# Patient Record
Sex: Female | Born: 1965 | Race: Black or African American | Hispanic: No | Smoking: Never smoker
Health system: Southern US, Community
[De-identification: ages and names within clinical notes are randomized; demographics above are authoritative.]

## PROBLEM LIST (undated history)

## (undated) DIAGNOSIS — D352 Benign neoplasm of pituitary gland: Secondary | ICD-10-CM

## (undated) DIAGNOSIS — M7520 Bicipital tendinitis, unspecified shoulder: Secondary | ICD-10-CM

## (undated) HISTORY — DX: Benign neoplasm of pituitary gland: D35.2

## (undated) HISTORY — DX: Bicipital tendinitis, unspecified shoulder: M75.20

## (undated) HISTORY — PX: CHOLECYSTECTOMY: SHX55

## (undated) HISTORY — PX: ABDOMINAL HYSTERECTOMY: SHX81

## (undated) HISTORY — PX: TONSILLECTOMY: SUR1361

## (undated) HISTORY — PX: MYOMECTOMY: SHX85

---

## 2009-09-10 ENCOUNTER — Emergency Department: Payer: Self-pay | Admitting: Internal Medicine

## 2009-11-23 ENCOUNTER — Emergency Department: Payer: Self-pay | Admitting: Emergency Medicine

## 2009-11-28 ENCOUNTER — Ambulatory Visit: Payer: Self-pay | Admitting: Internal Medicine

## 2009-12-17 ENCOUNTER — Ambulatory Visit: Payer: Self-pay | Admitting: Internal Medicine

## 2009-12-23 ENCOUNTER — Ambulatory Visit: Payer: Self-pay | Admitting: Family Medicine

## 2010-03-19 ENCOUNTER — Ambulatory Visit: Payer: Self-pay | Admitting: Otolaryngology

## 2010-05-28 ENCOUNTER — Ambulatory Visit: Payer: Self-pay | Admitting: Otolaryngology

## 2010-09-13 ENCOUNTER — Emergency Department: Payer: Self-pay | Admitting: Emergency Medicine

## 2010-10-11 ENCOUNTER — Emergency Department: Payer: Self-pay | Admitting: Internal Medicine

## 2011-04-03 ENCOUNTER — Ambulatory Visit: Payer: Self-pay | Admitting: Otolaryngology

## 2011-04-24 ENCOUNTER — Ambulatory Visit: Payer: Self-pay | Admitting: Otolaryngology

## 2011-06-24 ENCOUNTER — Ambulatory Visit: Payer: Self-pay | Admitting: Internal Medicine

## 2011-07-03 DIAGNOSIS — N952 Postmenopausal atrophic vaginitis: Secondary | ICD-10-CM | POA: Insufficient documentation

## 2011-07-03 DIAGNOSIS — R3129 Other microscopic hematuria: Secondary | ICD-10-CM | POA: Insufficient documentation

## 2011-07-03 DIAGNOSIS — IMO0002 Reserved for concepts with insufficient information to code with codable children: Secondary | ICD-10-CM | POA: Insufficient documentation

## 2011-07-03 DIAGNOSIS — N390 Urinary tract infection, site not specified: Secondary | ICD-10-CM | POA: Insufficient documentation

## 2011-07-03 DIAGNOSIS — N3281 Overactive bladder: Secondary | ICD-10-CM | POA: Insufficient documentation

## 2011-07-16 DIAGNOSIS — R9389 Abnormal findings on diagnostic imaging of other specified body structures: Secondary | ICD-10-CM | POA: Insufficient documentation

## 2011-08-30 ENCOUNTER — Inpatient Hospital Stay: Payer: Self-pay | Admitting: Internal Medicine

## 2011-08-30 LAB — COMPREHENSIVE METABOLIC PANEL
Anion Gap: 12 (ref 7–16)
Calcium, Total: 8.8 mg/dL (ref 8.5–10.1)
Chloride: 104 mmol/L (ref 98–107)
Co2: 27 mmol/L (ref 21–32)
Creatinine: 0.92 mg/dL (ref 0.60–1.30)
EGFR (African American): >60
EGFR (Non-African Amer.): >60
Osmolality: 285 (ref 275–301)
Potassium: 3.9 mmol/L (ref 3.5–5.1)
SGOT(AST): 32 U/L (ref 15–37)
SGPT (ALT): 49 U/L

## 2011-08-30 LAB — TROPONIN I: Troponin-I: <0.02 ng/mL

## 2011-08-30 LAB — CBC
HGB: 13.3 g/dL (ref 12.0–16.0)
MCH: 30.7 pg (ref 26.0–34.0)
MCHC: 33.9 g/dL (ref 32.0–36.0)
MCV: 91 fL (ref 80–100)
RBC: 4.31 10*6/uL (ref 3.80–5.20)
RDW: 13.9 % (ref 11.5–14.5)
WBC: 4.8 10*3/uL (ref 3.6–11.0)

## 2011-08-30 LAB — CK TOTAL AND CKMB (NOT AT ARMC): CK, Total: 129 U/L (ref 21–215)

## 2011-08-31 LAB — CBC WITH DIFFERENTIAL/PLATELET
Basophil #: 0 10*3/uL (ref 0.0–0.1)
Basophil %: 0.6 %
Eosinophil #: 0.1 10*3/uL (ref 0.0–0.7)
HGB: 12.4 g/dL (ref 12.0–16.0)
Lymphocyte %: 31.2 %
MCHC: 33.4 g/dL (ref 32.0–36.0)
Neutrophil #: 2.9 10*3/uL (ref 1.4–6.5)
RBC: 4.1 10*6/uL (ref 3.80–5.20)
RDW: 14.6 % — ABNORMAL HIGH (ref 11.5–14.5)
WBC: 4.8 10*3/uL (ref 3.6–11.0)

## 2011-08-31 LAB — LIPID PANEL
Cholesterol: 213 mg/dL — ABNORMAL HIGH (ref 0–200)
HDL Cholesterol: 67 mg/dL — ABNORMAL HIGH (ref 40–60)
Triglycerides: 112 mg/dL (ref 0–200)
VLDL Cholesterol, Calc: 22 mg/dL (ref 5–40)

## 2011-08-31 LAB — BASIC METABOLIC PANEL
BUN: 15 mg/dL (ref 7–18)
Calcium, Total: 8.9 mg/dL (ref 8.5–10.1)
Chloride: 103 mmol/L (ref 98–107)
Creatinine: 0.73 mg/dL (ref 0.60–1.30)
EGFR (African American): >60
EGFR (Non-African Amer.): >60
Glucose: 83 mg/dL (ref 65–99)
Potassium: 3.8 mmol/L (ref 3.5–5.1)
Sodium: 141 mmol/L (ref 136–145)

## 2011-08-31 LAB — TSH: Thyroid Stimulating Horm: 2.75 u[IU]/mL

## 2011-10-24 ENCOUNTER — Emergency Department: Payer: Self-pay | Admitting: Emergency Medicine

## 2011-11-06 ENCOUNTER — Emergency Department: Payer: Self-pay | Admitting: Emergency Medicine

## 2011-12-25 ENCOUNTER — Ambulatory Visit: Payer: Self-pay | Admitting: Otolaryngology

## 2012-01-07 ENCOUNTER — Ambulatory Visit: Payer: Self-pay | Admitting: Internal Medicine

## 2012-01-07 ENCOUNTER — Ambulatory Visit: Payer: Self-pay | Admitting: Family Medicine

## 2012-03-12 ENCOUNTER — Emergency Department: Payer: Self-pay | Admitting: Unknown Physician Specialty

## 2012-03-12 LAB — CK TOTAL AND CKMB (NOT AT ARMC)
CK, Total: 98 U/L (ref 21–215)
CK-MB: <0.5 ng/mL — ABNORMAL LOW (ref 0.5–3.6)

## 2012-03-12 LAB — BASIC METABOLIC PANEL
Anion Gap: 5 — ABNORMAL LOW (ref 7–16)
Calcium, Total: 8.8 mg/dL (ref 8.5–10.1)
Chloride: 107 mmol/L (ref 98–107)
Co2: 30 mmol/L (ref 21–32)
Creatinine: 0.83 mg/dL (ref 0.60–1.30)
EGFR (African American): >60
Osmolality: 283 (ref 275–301)
Sodium: 142 mmol/L (ref 136–145)

## 2012-03-12 LAB — CBC
HCT: 36 % (ref 35.0–47.0)
HGB: 12.3 g/dL (ref 12.0–16.0)
MCHC: 34 g/dL (ref 32.0–36.0)
RBC: 4.11 10*6/uL (ref 3.80–5.20)
WBC: 6.3 10*3/uL (ref 3.6–11.0)

## 2012-03-12 LAB — TROPONIN I: Troponin-I: <0.02 ng/mL

## 2012-03-13 LAB — HEPATIC FUNCTION PANEL A (ARMC)
Alkaline Phosphatase: 85 U/L (ref 50–136)
Bilirubin, Direct: <0.1 mg/dL (ref 0.00–0.20)
Bilirubin,Total: 0.2 mg/dL (ref 0.2–1.0)
SGOT(AST): 19 U/L (ref 15–37)

## 2012-03-13 LAB — LIPASE, BLOOD: Lipase: 106 U/L (ref 73–393)

## 2012-03-13 LAB — TROPONIN I: Troponin-I: <0.02 ng/mL

## 2012-04-13 ENCOUNTER — Emergency Department: Payer: Self-pay | Admitting: Unknown Physician Specialty

## 2012-06-01 ENCOUNTER — Ambulatory Visit: Payer: Self-pay | Admitting: Internal Medicine

## 2012-06-26 ENCOUNTER — Emergency Department: Payer: Self-pay | Admitting: Emergency Medicine

## 2012-06-26 LAB — CBC
HGB: 12.4 g/dL (ref 12.0–16.0)
MCH: 29.2 pg (ref 26.0–34.0)
MCV: 90 fL (ref 80–100)
Platelet: 256 10*3/uL (ref 150–440)
RBC: 4.25 10*6/uL (ref 3.80–5.20)
WBC: 7.8 10*3/uL (ref 3.6–11.0)

## 2012-06-26 LAB — BASIC METABOLIC PANEL
Anion Gap: 6 — ABNORMAL LOW (ref 7–16)
Chloride: 105 mmol/L (ref 98–107)
Co2: 27 mmol/L (ref 21–32)
EGFR (African American): >60
Glucose: 103 mg/dL — ABNORMAL HIGH (ref 65–99)
Osmolality: 276 (ref 275–301)

## 2012-06-26 LAB — CK TOTAL AND CKMB (NOT AT ARMC)
CK, Total: 93 U/L (ref 21–215)
CK-MB: <0.5 ng/mL — ABNORMAL LOW (ref 0.5–3.6)

## 2012-06-26 LAB — TROPONIN I: Troponin-I: <0.02 ng/mL

## 2012-06-27 LAB — CK TOTAL AND CKMB (NOT AT ARMC)
CK, Total: 79 U/L (ref 21–215)
CK-MB: <0.5 ng/mL — ABNORMAL LOW (ref 0.5–3.6)

## 2012-07-20 ENCOUNTER — Ambulatory Visit: Payer: Self-pay | Admitting: Internal Medicine

## 2012-09-03 ENCOUNTER — Emergency Department: Payer: Self-pay | Admitting: Internal Medicine

## 2012-09-03 LAB — COMPREHENSIVE METABOLIC PANEL
Albumin: 3.6 g/dL (ref 3.4–5.0)
Alkaline Phosphatase: 84 U/L (ref 50–136)
Anion Gap: 6 — ABNORMAL LOW (ref 7–16)
BUN: 10 mg/dL (ref 7–18)
Bilirubin,Total: 0.2 mg/dL (ref 0.2–1.0)
Calcium, Total: 8.7 mg/dL (ref 8.5–10.1)
Chloride: 106 mmol/L (ref 98–107)
Co2: 29 mmol/L (ref 21–32)
EGFR (African American): >60
EGFR (Non-African Amer.): >60
Glucose: 78 mg/dL (ref 65–99)
Osmolality: 279 (ref 275–301)
Potassium: 3.9 mmol/L (ref 3.5–5.1)
SGOT(AST): 21 U/L (ref 15–37)
SGPT (ALT): 25 U/L (ref 12–78)

## 2012-09-03 LAB — URINALYSIS, COMPLETE
Bacteria: NONE SEEN
Bilirubin,UR: NEGATIVE
Glucose,UR: NEGATIVE mg/dL (ref 0–75)
Ketone: NEGATIVE
Leukocyte Esterase: NEGATIVE
Ph: 5 (ref 4.5–8.0)
Protein: NEGATIVE
RBC,UR: 27 /HPF (ref 0–5)
Squamous Epithelial: 2
WBC UR: 1 /HPF (ref 0–5)

## 2012-09-03 LAB — CBC
MCH: 29.2 pg (ref 26.0–34.0)
MCHC: 33.4 g/dL (ref 32.0–36.0)
Platelet: 314 10*3/uL (ref 150–440)
RDW: 14 % (ref 11.5–14.5)
WBC: 5.3 10*3/uL (ref 3.6–11.0)

## 2012-09-03 LAB — LIPASE, BLOOD: Lipase: 106 U/L (ref 73–393)

## 2012-11-06 LAB — TROPONIN I: Troponin-I: <0.02 ng/mL

## 2012-11-06 LAB — CBC
HCT: 34.4 % — ABNORMAL LOW (ref 35.0–47.0)
HGB: 11.5 g/dL — ABNORMAL LOW (ref 12.0–16.0)
Platelet: 285 10*3/uL (ref 150–440)
RBC: 3.98 10*6/uL (ref 3.80–5.20)
RDW: 14.6 % — ABNORMAL HIGH (ref 11.5–14.5)
WBC: 7.5 10*3/uL (ref 3.6–11.0)

## 2012-11-06 LAB — BASIC METABOLIC PANEL
Anion Gap: 7 (ref 7–16)
Calcium, Total: 8.7 mg/dL (ref 8.5–10.1)
Co2: 24 mmol/L (ref 21–32)
Creatinine: 0.92 mg/dL (ref 0.60–1.30)
Glucose: 91 mg/dL (ref 65–99)
Sodium: 138 mmol/L (ref 136–145)

## 2012-11-06 LAB — CK TOTAL AND CKMB (NOT AT ARMC)
CK, Total: 83 U/L (ref 21–215)
CK-MB: <0.5 ng/mL — ABNORMAL LOW (ref 0.5–3.6)

## 2012-11-07 ENCOUNTER — Observation Stay: Payer: Self-pay | Admitting: Internal Medicine

## 2012-11-07 LAB — TROPONIN I: Troponin-I: <0.02 ng/mL

## 2012-11-07 LAB — CK TOTAL AND CKMB (NOT AT ARMC)
CK, Total: 58 U/L (ref 21–215)
CK, Total: 60 U/L (ref 21–215)
CK-MB: <0.5 ng/mL — ABNORMAL LOW (ref 0.5–3.6)
CK-MB: <0.5 ng/mL — ABNORMAL LOW (ref 0.5–3.6)

## 2012-11-07 LAB — LIPID PANEL
Cholesterol: 195 mg/dL (ref 0–200)
Ldl Cholesterol, Calc: 119 mg/dL — ABNORMAL HIGH (ref 0–100)
Triglycerides: 101 mg/dL (ref 0–200)
VLDL Cholesterol, Calc: 20 mg/dL (ref 5–40)

## 2012-12-29 ENCOUNTER — Ambulatory Visit: Payer: Self-pay | Admitting: Otolaryngology

## 2013-01-09 ENCOUNTER — Ambulatory Visit: Payer: Self-pay | Admitting: Otolaryngology

## 2013-01-09 LAB — CALCIUM
Calcium, Total: 8.9 mg/dL (ref 8.5–10.1)
Calcium, Total: 8.9 mg/dL (ref 8.5–10.1)

## 2013-01-16 ENCOUNTER — Ambulatory Visit: Payer: Self-pay | Admitting: Otolaryngology

## 2013-01-31 ENCOUNTER — Ambulatory Visit: Payer: Self-pay | Admitting: Otolaryngology

## 2013-02-08 ENCOUNTER — Ambulatory Visit: Payer: Self-pay | Admitting: Otolaryngology

## 2013-02-14 ENCOUNTER — Institutional Professional Consult (permissible substitution): Payer: Self-pay | Admitting: Pulmonary Disease

## 2013-02-15 ENCOUNTER — Encounter: Payer: Self-pay | Admitting: Otolaryngology

## 2013-06-09 ENCOUNTER — Emergency Department: Payer: Self-pay | Admitting: Emergency Medicine

## 2013-06-09 LAB — BASIC METABOLIC PANEL
Anion Gap: 7 (ref 7–16)
BUN: 12 mg/dL (ref 7–18)
Calcium, Total: 9.1 mg/dL (ref 8.5–10.1)
Co2: 27 mmol/L (ref 21–32)
Creatinine: 0.91 mg/dL (ref 0.60–1.30)
EGFR (African American): >60
Glucose: 90 mg/dL (ref 65–99)
Osmolality: 277 (ref 275–301)
Potassium: 3.3 mmol/L — ABNORMAL LOW (ref 3.5–5.1)
Sodium: 139 mmol/L (ref 136–145)

## 2013-06-09 LAB — TROPONIN I: Troponin-I: <0.02 ng/mL

## 2013-06-09 LAB — CBC
HGB: 12.8 g/dL (ref 12.0–16.0)
MCH: 29.1 pg (ref 26.0–34.0)
MCHC: 33.9 g/dL (ref 32.0–36.0)
RBC: 4.4 10*6/uL (ref 3.80–5.20)
RDW: 14.7 % — ABNORMAL HIGH (ref 11.5–14.5)
WBC: 4.7 10*3/uL (ref 3.6–11.0)

## 2013-07-14 DIAGNOSIS — J111 Influenza due to unidentified influenza virus with other respiratory manifestations: Secondary | ICD-10-CM | POA: Insufficient documentation

## 2013-07-14 DIAGNOSIS — IMO0001 Reserved for inherently not codable concepts without codable children: Secondary | ICD-10-CM | POA: Insufficient documentation

## 2013-08-03 ENCOUNTER — Ambulatory Visit: Payer: Self-pay | Admitting: Internal Medicine

## 2013-09-20 ENCOUNTER — Ambulatory Visit: Payer: Self-pay | Admitting: Neurology

## 2013-09-21 ENCOUNTER — Inpatient Hospital Stay: Payer: Self-pay | Admitting: Internal Medicine

## 2013-09-21 LAB — COMPREHENSIVE METABOLIC PANEL
ALBUMIN: 3.5 g/dL (ref 3.4–5.0)
ALK PHOS: 52 U/L
ANION GAP: 5 — AB (ref 7–16)
AST: 27 U/L (ref 15–37)
BUN: 13 mg/dL (ref 7–18)
Bilirubin,Total: 0.3 mg/dL (ref 0.2–1.0)
CHLORIDE: 109 mmol/L — AB (ref 98–107)
Calcium, Total: 8.6 mg/dL (ref 8.5–10.1)
Co2: 24 mmol/L (ref 21–32)
Creatinine: 0.82 mg/dL (ref 0.60–1.30)
EGFR (African American): >60
GLUCOSE: 75 mg/dL (ref 65–99)
Osmolality: 274 (ref 275–301)
Potassium: 4.3 mmol/L (ref 3.5–5.1)
SGPT (ALT): 18 U/L (ref 12–78)
Sodium: 138 mmol/L (ref 136–145)
Total Protein: 7.5 g/dL (ref 6.4–8.2)

## 2013-09-21 LAB — CBC WITH DIFFERENTIAL/PLATELET
BASOS ABS: 0 10*3/uL (ref 0.0–0.1)
Basophil %: 0.8 %
Eosinophil #: 0 10*3/uL (ref 0.0–0.7)
Eosinophil %: 1 %
HCT: 39.6 % (ref 35.0–47.0)
HGB: 12.6 g/dL (ref 12.0–16.0)
LYMPHS ABS: 1.5 10*3/uL (ref 1.0–3.6)
Lymphocyte %: 32.1 %
MCH: 27.9 pg (ref 26.0–34.0)
MCHC: 31.8 g/dL — AB (ref 32.0–36.0)
MCV: 88 fL (ref 80–100)
MONOS PCT: 7.8 %
Monocyte #: 0.4 x10 3/mm (ref 0.2–0.9)
NEUTROS ABS: 2.8 10*3/uL (ref 1.4–6.5)
NEUTROS PCT: 58.3 %
Platelet: 257 10*3/uL (ref 150–440)
RBC: 4.52 10*6/uL (ref 3.80–5.20)
RDW: 14.9 % — ABNORMAL HIGH (ref 11.5–14.5)
WBC: 4.8 10*3/uL (ref 3.6–11.0)

## 2013-09-21 LAB — TROPONIN I: Troponin-I: <0.02 ng/mL

## 2013-09-21 LAB — URINALYSIS, COMPLETE
Bacteria: NONE SEEN
Bilirubin,UR: NEGATIVE
Glucose,UR: NEGATIVE mg/dL (ref 0–75)
Ketone: NEGATIVE
Leukocyte Esterase: NEGATIVE
Nitrite: NEGATIVE
Ph: 5 (ref 4.5–8.0)
Protein: NEGATIVE
RBC,UR: 40 /HPF (ref 0–5)
SPECIFIC GRAVITY: 1.021 (ref 1.003–1.030)
Squamous Epithelial: 2

## 2013-09-22 LAB — DRUG SCREEN, URINE
Amphetamines, Ur Screen: POSITIVE (ref ?–1000)
BARBITURATES, UR SCREEN: NEGATIVE (ref ?–200)
Benzodiazepine, Ur Scrn: NEGATIVE (ref ?–200)
CANNABINOID 50 NG, UR ~~LOC~~: NEGATIVE (ref ?–50)
Cocaine Metabolite,Ur ~~LOC~~: NEGATIVE (ref ?–300)
MDMA (Ecstasy)Ur Screen: NEGATIVE (ref ?–500)
Methadone, Ur Screen: NEGATIVE (ref ?–300)
Opiate, Ur Screen: NEGATIVE (ref ?–300)
Phencyclidine (PCP) Ur S: NEGATIVE (ref ?–25)
Tricyclic, Ur Screen: NEGATIVE (ref ?–1000)

## 2013-09-22 LAB — LIPID PANEL
Cholesterol: 149 mg/dL (ref 0–200)
HDL Cholesterol: 43 mg/dL (ref 40–60)
Ldl Cholesterol, Calc: 91 mg/dL (ref 0–100)
TRIGLYCERIDES: 74 mg/dL (ref 0–200)
VLDL Cholesterol, Calc: 15 mg/dL (ref 5–40)

## 2013-10-17 ENCOUNTER — Encounter: Payer: Self-pay | Admitting: Internal Medicine

## 2013-10-20 ENCOUNTER — Ambulatory Visit: Payer: Self-pay | Admitting: Neurology

## 2013-11-10 ENCOUNTER — Encounter: Payer: Self-pay | Admitting: Internal Medicine

## 2014-01-02 ENCOUNTER — Ambulatory Visit: Payer: Self-pay | Admitting: Family Medicine

## 2014-10-03 DIAGNOSIS — M7540 Impingement syndrome of unspecified shoulder: Secondary | ICD-10-CM | POA: Insufficient documentation

## 2014-11-02 NOTE — Discharge Summary (Signed)
PATIENT NAME:  Mary Estrada, RAMP MR#:  [id number] DATE OF BIRTH:  1965-08-17  DATE OF ADMISSION:  11/07/2012 DATE OF DISCHARGE:  11/07/2012  PRIMARY CARE PHYSICIAN: Ellin Goodie.    DISCHARGE DIAGNOSES: 1.  Musculoskeletal chest pain.  2.  History of transient ischemic attack.   IMAGING STUDIES DONE: Include:  1.  A nuclear Myoview which showed no reversible ischemia.  2.  CT of the chest showed no PE, pulmonary edema or infiltrate.  3.  Chest x-ray showed no acute rib fractures, infiltrate or edema or pleural effusions.   ADMITTING HISTORY AND PHYSICAL AND HOSPITAL COURSE: Please see detailed H and P dictated previously. In brief, a 49 year old female patient with prior history of TIA who presented to the hospital complaining of chest pain. The patient had recurrent ER visits for the same chest pain, was admitted. Three sets of cardiac enzymes were negative, had a stress test which was negative. CT of the chest was done which showed no PE. The patient likely had musculoskeletal chest pain and was discharged home in fair condition.   DISCHARGE MEDICATIONS:  Include: 1.  Fioricet 1 tablet oral once a day as needed.  2.  Amitiza 24 mcg oral 2 times a day.  3.  Omeprazole 40 mg oral once a day.  4.  Ropinirole 1 tablet oral once a day at bedtime.  5.  Cetrizine 10 mg oral once a day.  6.  Advil 200 mg 2 tablets oral 3 times a day for 3 days, then p.r.n.   DISCHARGE INSTRUCTIONS: The patient was discharged home on a low-fat, low-cholesterol diet. Activity as tolerated. Follow up with primary care physician in 1 to 2 weeks.   TIME SPENT: On day of discharge on seeing the patient, evaluation, following up on the test and discharge activity was 56 minutes.   ____________________________ Leia Alf Zurich Carreno, MD srs:cs D: 11/08/2012 13:15:30 ET T: 11/08/2012 15:23:10 ET JOB#: [id number]  cc: Alveta Heimlich R. Odis Turck, MD, <Dictator> Neita Carp MD ELECTRONICALLY SIGNED 11/23/2012 13:52

## 2014-11-02 NOTE — Discharge Summary (Signed)
Dates of Admission and Diagnosis:  Date of Admission 09-Jan-2013   Date of Discharge 11-Jan-2013   Admitting Diagnosis s/p Total thyroidectomy   Final Diagnosis s/p Total thyroidectomy   Discharge Diagnosis 1 s/p Total thyroidectomy   2 Non-toxic multinodular goiter    Chief Complaint/History of Present Illness 49 y.o. female with history of non-toxic multinodular goiter with compressive symptoms presenting for total thyroidectomy.   Routine Chem:  30-Jun-14 10:10   Calcium (Total), Serum 8.9 (Result(s) reported on 09 Jan 2013 at 10:45AM.)    15:44   Calcium (Total), Serum 8.9 (Result(s) reported on 09 Jan 2013 at 04:03PM.)   Hospital Course:  Hospital Course Admitted to floor following procedure.  Pain controlled with oral medications.  Ambulating to bathroom.  Calciums followed and stable at 8.9.  Some pain issues on 01/10/13 and improved on 01/11/13 at time of discharge.   Condition on Discharge Good   DISCHARGE INSTRUCTIONS HOME MEDS:  Medication Reconciliation: Patient's Home Medications at Discharge:     Medication Instructions  multivitamin  2 tab(s) orally once a day (at bedtime)   omeprazole 40 mg oral delayed release capsule  1 cap(s) orally once a day (in the morning)   cetirizine 10 mg oral tablet  1 tab(s) orally once a day (at bedtime)   amitiza 24 mcg oral capsule  1 cap(s) orally every other day    acetaminophen 325 mg oral tablet  2 tab(s) orally every 4 hours, As needed, pain or temp. greater than 100.4   oxycodone 5 mg/5 ml oral solution  10 milliliter(s) orally every 4 hours, As needed, pain   promethazine  12.5 milligram(s) PO every 4 hours, As Needed, nausea, vomiting , As needed, nausea, vomiting   calcium-vitamin d 500 mg-200 intl units oral tablet  2 tab(s) orally 3 times a day (with meals) for 1 week then 2 tabs 2 times a day for 1 week then 2 tabs once a day   benzocaine-menthol topical  1 lozenge orally every 2 hours, As needed, cough, sore throat    levothyroxine 125 mcg (0.125 mg) oral tablet  1 tab(s) orally once a day     Physician's Instructions:  Home Health? No   Treatments None   Dressing Care Keep dressing dry  May shower   Home Oxygen? No   Diet Regular   Diet Consistency Mechanical Soft   Activity Limitations No exertional activity  No heavy lifting   Return to Work 2 weeks   Time frame for Follow Up Appointment 1-2 weeks  [facility]   Electronic Signatures: Mette Southgate, Shela Leff (MD)  (Signed 02-Jul-14 11:56)  Authored: ADMISSION DATE AND DIAGNOSIS, CHIEF COMPLAINT/HPI, PERTINENT [place], PATIENT INSTRUCTIONS   Last Updated: 02-Jul-14 11:56 by Pascal Lux (MD)

## 2014-11-02 NOTE — Op Note (Signed)
PATIENT NAME:  Mary Estrada, Mary Estrada MR#:  [id number] DATE OF BIRTH:  13-Aug-1965  DATE OF PROCEDURE:  01/09/2013  PREOPERATIVE DIAGNOSES: Nontoxic multi-nodular goiter.   POSTOPERATIVE DIAGNOSIS: Nontoxic multi-nodular goiter.   PROCEDURE PERFORMED: Minimally invasive total thyroidectomy with laryngeal nerve monitoring.   SURGEON: Carloyn Manner, M.D.   ASSISTANT: Dr. Malon Kindle.  ANESTHESIA: General endotracheal anesthesia.   ESTIMATED BLOOD LOSS: 20 mL.   IV FLUIDS: Please see anesthesia record.   COMPLICATIONS: None.   DRAINS/STENT PLACEMENTS: None.   SPECIMENS: Total thyroid with a stitch in the right superior lobe.   INDICATIONS FOR PROCEDURE: The patient is a 49 year old female with history of a nontoxic multi-nodular goiter with compressive symptoms, presenting for total thyroidectomy.   OPERATIVE FINDINGS: Bilateral recurrent laryngeal nerves were identified and preserved. Bilateral superior and inferior parathyroid glands were identified and preserved.   DESCRIPTION OF PROCEDURE: After the patient was identified in holding, the patient was taken to the operating room and placed in the supine position. General endotracheal anesthesia was induced with laryngeal nerve monitor in correct position. The patient was prepped and draped in sterile fashion after 6 mL of 0.25% Marcaine 1:200,000 epinephrine were injected into a previously marked anterior neck crease. A 15 blade scalpel was used to make a horizontal skin incision along the previously marked crease. Dissection was carried down through subcutaneous tissues down through the level of the platysma using Bovie electrocautery. Strap muscles were divided from the thyroid notch down to the sternum using Bovie electrocautery and at the anterior border of the thyroid the isthmus was encountered.   Attention was directed to patient's left side initially. Sternohyoid and sternothyroid muscles were bluntly dissected away from the left  thyroid lobe with Terris elevators. The lateral edge of the thyroid was encountered. The great vessels were encountered and dissection was carried superiorly and inferiorly bluntly. The superior pole of the patient's left side was identified and dissected lateral to this and then Joel's space was identified between the trachea and the superior thyroid lobe on the left. This was bluntly dissected and the left superior thyroid vessels were pedicled and these were transected using a Harmonic scalpel. Dissection was carried more inferiorly at the gland was brought through the incision site and the recurrent laryngeal nerve was identified in the tracheoesophageal groove just beneath the left inferior thyroid artery. At this time, the nerve was identified and then traced up until its insertion into the patient's larynx and then the left hemi-thyroid was removed from the anterior trachea. Care was taken to avoid injury to left recurrent laryngeal nerve. The superior and inferior parathyroid glands were identified in their appropriate position.   Attention at this time was directed to patient's right neck and in a similar fashion, the patient's right neck was evaluated. The sternohyoid and sternothyroid muscles were separated from the right hemi-thyroid. The right hemi-thyroid was dissected laterally. The great vessels were identified and dissection was carried inferiorly and superiorly lateral to the superior pole on the right side and Joel's space on the patient's right side again was bluntly dissected between the trachea and the superior thyroid pole. This pedicled the right superior thyroid vessels and then using a Harmonic scalpel the vessels were ligated right at the thyroid capsule. At this time, the right hemi-thyroid was able to be delivered through the incision site. Again in the tracheoesophageal groove, the right recurrent laryngeal nerve was identified just beneath the inferior thyroid artery. The right  superior and inferior parathyroid glands were  also identified. The nerve was traced until its insertion into the patient's larynx and then Berry's ligament was transected using a combination of Harmonic scalpel and bipolar. At this time, the remaining attachments of the right hemi-thyroid to the trachea were separated using a combination of Bovie electrocautery as well as the Harmonic scalpel and bipolar. Care was taken to avoid injury of the recurrent laryngeal nerve. The recurrent laryngeal nerve was stimulated bilaterally at the completion of the case with robust stimulation. At this time, the patient's thyroid was passed off the table for permanent pathological evaluation after it was marked in the right superior thyroid lobe. The patient's wound was copiously irrigated. Meticulous hemostasis was achieved and bilateral Surgicel was placed within the thyroid bed after stimulation of the nerve again bilaterally. At this time, the strap muscles were closed in a figure-of-eight fashion and then the subcutaneous tissue was closed with interrupted Vicryl and the skin was closed with Dermabond skin adhesive and topped with a Steri-Strip. Care of the patient at this time was transferred to anesthesia.   ____________________________ Jerene Bears, MD ccv:aw D: 01/09/2013 09:39:51 ET T: 01/09/2013 10:08:08 ET JOB#: [id number]  cc: Jerene Bears, MD, <Dictator> Jerene Bears MD ELECTRONICALLY SIGNED 01/15/2013 22:48

## 2014-11-02 NOTE — H&P (Signed)
PATIENT NAME:  Mary Estrada, Mary Estrada MR#:  [id number] DATE OF BIRTH:  Aug 25, 1965  DATE OF ADMISSION:  11/07/2012  REFERRING PHYSICIAN: Dr. Lurline Hare  PRIMARY CARE PHYSICIAN: Dr. Kingsley Spittle    CHIEF COMPLAINT: Chest pain.   HISTORY OF PRESENT ILLNESS: This is a 49 year old female with significant past medical history of transient ischemic attack in the past who presents with complaints of chest pain. The patient reports she had chest pain that developed yesterday while she was sleeping at rest, waxed and waned all day long with relieving or provoking factors. Described it as pressure-like quality, non-radiating. Was accompanied by some nausea, sweating and mild shortness of breath. The patient received 324 of aspirin in the ED. Patient's EKG did not show any significant finding. First troponin was negative. The patient reports she had multiple episodes of chest pain over the last year, and by reviewing her records she had a couple of ED visits for chest pain where she did not follow with cardiology. As well, the patient reports she had a treadmill stress test a few years ago in [place] for complaints of chest pain and reports her treadmill stress test was negative.  The patient denies any history of smoking. Hospitalist service was requested to admit and evaluate the patient for further work-up for her chest pain. The patient denies any history of hyperlipidemia. With reviewing her past medical history, seems at one point she was on statin treatment.   PAST MEDICAL HISTORY: 1. History of migraines.  2. Irritable bowel syndrome.  3. Reports history of pituitary tumor.  4. Two cysts in the liver.  5. One cyst in the bile duct.  6. Interstitial cystitis.  7. Transient ischemic attack in 2006 and 2008.  8. Thyroid cyst on goiter.   PAST SURGICAL HISTORY: 1. Tubal ligation.  2. Hysterectomy.  3. Cholecystectomy.  4. Tonsillectomy.  5. Benign cyst of the left breast.  6. Lasix surgery.    ALLERGIES: REGLAN AND PREDNISONE.   HOME MEDICATIONS: 1. Fioricet as needed.  2. Ropinirole. 3. Amitiza 1 capsule 2 times a day 24 mcg oral.  4. Omeprazole 40 mg oral daily.   SOCIAL HISTORY: No smoking. No alcohol. No drug use. She works as a Psychologist, sport and exercise.   FAMILY HISTORY: Significant for CVA in her father; diabetes and hypertension in her mother.   REVIEW OF SYSTEMS: CONSTITUTIONAL: The patient denies fever, chills, fatigue. weakness.  EYES: Denies blurry vision, double vision, pain, inflammation.  ENT: Denies tinnitus, ear pain, epistaxis or discharge.  RESPIRATORY: Denies cough, wheezing, hemoptysis, painful respiratory, chronic obstructive pulmonary disease. Had mild dyspnea.  CARDIOVASCULAR: Denies edema, orthopnea syncope. Had complaints of chest pain palpitations.  GASTROINTESTINAL: Had mild nausea. Denies vomiting, diarrhea, abdominal pain, hematemesis, rectal bleed, jaundice or bright red blood per rectum.  GENITOURINARY: Denies dysuria, hematuria, or renal colic.  ENDOCRINE: Denies polyuria, polydipsia, heat or cold intolerance.  HEMATOLOGY: Denies anemia, easy bruising, bleeding diathesis.  INTEGUMENTARY: Denies acne, rash or skin lesions.  MUSCULOSKELETAL: Denies any gout, redness, arthritis or cramps.  NEUROLOGIC: Denies any seizures, memory loss, headache, dementia, ataxia, vertigo, tremors. Has history of transient ischemic attack.  PSYCHIATRIC: Denies substance abuse, alcohol abuse, bipolar disorder or schizophrenia.   PHYSICAL EXAMINATION: VITAL SIGNS: Temperature 98.2, pulse 60, respiratory rate 20, blood pressure 128/75 and saturating 99% on room air.  GENERAL: Obese female who looks comfortable in bed in no apparent distress.  HEENT: Head atraumatic, normocephalic. Pupils equal, reactive to light. Pink conjunctivae. Anicteric sclera. Moist  oral mucosa.  NECK: Supple. No thyromegaly. No JVD.  CHEST: Good air entry bilaterally. No wheezing, rales, rhonchi.   CARDIOVASCULAR: S1, S2 heard. No rubs, murmurs, or gallops.  ABDOMEN: Soft, nontender, nondistended. Bowel sounds present.  EXTREMITIES: No edema. No clubbing. No cyanosis. Dorsalis pedis pulses and tibialis pulses bilaterally +2.  SKIN: Normal skin turgor. Warm and dry.  PSYCHIATRIC: Appropriate affect. Awake, alert x3. Intact judgment and insight.  NEUROLOGIC: Grossly intact. Motor five out of five.   PERTINENT LABS: Glucose 91, BUN 14, creatinine 0.92, sodium 138, potassium 3.6, chloride 107, CO2 24. Troponin less than 0.02. CK total 83, CK-MB less than 0.5, white blood cells 7.5, hemoglobin 11.5, hematocrit 34.4, platelets 285.   EKG showing normal sinus rhythm at 82 beats per minute without significant ST or T wave changes.   ASSESSMENT AND PLAN: 1. Chest pain: The patient reports her chest pain, currently much improved. She is on nitro paste. The patient was given 324 mg of aspirin. The patient will be admitted to telemetry unit, will repeat her troponins, and if negative we will schedule her for a stress test in the morning given the fact she had multiple presentations last year to ED with complaints of chest pain. As well, we will check her lipid panel, and if elevated we will start her on statin therapy.  2. History of transient ischemic attack. We will continue with aspirin. We will check lipid panel, and if elevated we will start her on statin.  3. Deep vein thrombosis prophylaxis: Sequential compression devices.  4. Gastrointestinal prophylaxis: Protonix.   CODE STATUS: FULL CODE.   Time spent on admission and patient care: 45 minutes.    ____________________________ Albertine Iceis, MD dse:aw D: 11/07/2012 04:24:46 ET T: 11/07/2012 06:02:23 ET JOB#: [id number]  cc: Albertine Wealthy, MD, <Dictator> DAWOOD Graciela Husbands MD ELECTRONICALLY SIGNED 11/08/2012 6:18

## 2014-11-03 NOTE — Discharge Summary (Signed)
PATIENT NAME:  Mary Estrada, Mary Estrada MR#:  [id number] DATE OF BIRTH:  1966/07/07  DATE OF ADMISSION:  09/21/2013 DATE OF DISCHARGE:  09/23/2013  PRIMARY CARE PHYSICIAN: Dr. Quay Burow  DISCHARGE DIAGNOSES:  1.  Functional neurologic deficits or cerebrovascular accident. 2.  Hypothyroidism.  3.  Gastroesophageal reflux disease. 4.  Amphetamine abuse, questionable.   CONDITION: Stable.   CODE STATUS: FULL.  HOME MEDICATIONS:  1.  Multivitamin 2 tabs at bedtime. 2.  Omeprazole 40 mg p.o. daily. 3.  Cetirizine 10 mg p.o. at bedtime. 4.  Calcium with vitamin D 500 mg/200 international units oral tablets once a day.  5.  Levothyroxine 137 mcg p.o. daily.  6.  Atorvastatin 20 mg p.o. daily.  7.  Aspirin 325 mg p.o. daily.   HOME CARE: The patient needs home health with physical therapy, occupational therapy and nurse aide.   DISCHARGE DIET: Low-sodium, low-fat, low-cholesterol.  DISCHARGE ACTIVITY: As tolerated.   FOLLOW-UP CARE: Follow up with PCP within 1 to 2 weeks. Follow up with Dr. Manuella Ghazi within 1 to 2 weeks.  CONSULTANTS: Neurology, Dr. Manuella Ghazi.   REASON FOR ADMISSION: Right-sided weakness.   HOSPITAL COURSE: The patient is a 49 year old African American female with history of TIA and migraines and self-reported pituitary tumor in the past who presented to the ED with right side weakness and associated with slurred speech. For detailed history and physical examination, please refer to the admission note dictated by Dr. Lavetta Nielsen. On admission date, EKG showed normal sinus rhythm. CAT scan of head did not show any intracranial process. MRI of the pituitary performed 1 day prior to symptoms revealed slight asymmetry of left side pituitary without discrete mass, otherwise normal MRI of brain. The patient's BMP was in normal range, WBC 4.8, hemoglobin 12.6, and urinalysis negative.  1.  Possible CVA with right side weakness. The patient has been treated with aspirin and statin after admission. Dr.  Manuella Ghazi, neurologist, evaluated the patient and suggests the patient possibly has functional neurologic deficit. The patient is going to need home health and physical therapy and occupational therapy.  2.  The patient's urine drug screen showed positive Amphetamine. 3.  Hypotension. The patient's blood pressure was relatively low. The patient was treated with normal saline and has improved.  4.  Hypothyroidism. On Synthroid.   The patient was clinically stable. She was discharged home with home health and physical therapy. I discussed the patient's discharge plan with the patient's, nurse, and case manager.  TIME SPENT: About 37 minutes.  ____________________________ Demetrios Loll, MD qc:sb D: 09/24/2013 14:24:08 ET T: 09/25/2013 10:35:53 ET JOB#: [id number]  cc: Demetrios Loll, MD, <Dictator> Demetrios Loll MD ELECTRONICALLY SIGNED 09/25/2013 16:33

## 2014-11-03 NOTE — Consult Note (Signed)
Primary Care Physician:  BURNS, HARRIET : [phone]  Reason for Consult: Admit Date: 21-Sep-2013  Chief Complaint: right sided weakness   History of Present Illness: History of Present Illness:   A 49 year old African American female, pt of my clinic, with past medical history of spells, as well as migraines and self reported pituitary tumor (MRI neg for any adenoma), presenting with acute onset of right-sided weakness, since 09/20/13, as well as associated slurred speech. Pt has been noted to have variable efforts on her right sided weakness (e.g. walked with nurse from bathroom to bed but couldn't move right leg at all when I asked her to mover her leg.)  MEDICAL HISTORY: TIA x 2, migraine, history of pituitary tumor.  HISTORY: Denies alcohol, tobacco or drug usage.  HISTORY: Positive for CVAs, diabetes, hypertension.  PREDNISONE, REGLAN AND TAPE.   Reviewed ROS from admitting physician and confirmed with pt.  REVIEW OF SYSTEMS: Difficult to obtain given the patient's unwillingness to cooperate. Denies fevers, chills. Positive for fatigue. Denies blurred vision, double vision, eye pain. Denies tinnitus, ear pain, hearing loss. Denies cough, wheeze, shortness of breath. Denies chest pain, palpitations, edema. Denies nausea, vomiting, diarrhea, abdominal pain. Denies dysuria or hematuria. Denies nocturia or thyroid problems. AND LYMPHATIC: Denies easy bruising or bleeding. Denies rashes or lesions. Denies pain in neck, back, shoulder, knees, hips or arthritic symptoms. Positive for weakness on the right side, as well as slurred speech. Denies any tremors, ataxia or headaches. Denies anxiety or depressive symptoms.   Allergies:  Reglan: Swelling  Prednisone: Other, Pain  Tape: Other  Vital Signs: **Vital Signs.:   13-Mar-15 21:31  Vital Signs Type Routine  Temperature Temperature (F) 97.8  Celsius 36.5  Pulse Pulse 78  Respirations Respirations 18  Systolic BP Systolic BP 415   Diastolic BP (mmHg) Diastolic BP (mmHg) 70  Mean BP 81  Pulse Ox % Pulse Ox % 97  Pulse Ox Activity Level  At rest  Oxygen Delivery Room Air/ 21 %   EXAM: General Exam Patient looks appropriate of age, well built, nourished and appropriately groomed.   Cardiovascular Exam: S1, S2 heart sounds present Carotid exam revealed no bruit Lung exam was clear to auscultation belly soft  Neurological Exam      Mental Status:      Alert     Oriented to time, place, person and situation  poor  Attention span and concentration,   Memory - poor participation.     Intact naming, repetition, comprehension.       Followed 2 step commands - no dysarthria (except braces related changes)     Fund of knowledge seemed appropriate for age and health status.       Cranial Nerves:      Olfactory and vagus nerves not are examined      Visual fields were full      Pupils were equal, round and reactive to light and accommodation      Extra-ocular movements are normal      Facial sensations are normal      Face is symmetric (minimal change in right nasolabial fold compared to left - baseline?)      Finger rub was heard symmetric in both ears      Palate and uvular movements are normal and oral sensations are OK      Neck muscle strength and shoulder shrug is normal      Tongue protrusion and uvular elevation are normal  Motor Exam:      Tone is normal in all extremities pt did not move Rt hand for me (except thumb movement) and no movement of right LE (+ve Hoover's sign)      Deep Tendon Reflexes:      symmetric 2 +      Right Toes are down going,  Left Toes are down going            Sensory Exam:      loss of sensation in right UE and LE       Co-ordination:      Finger to nose is normal on left            Gait:      Gait - rt. hemiparetic but very atypica (e.g. was able to bear some weight on right side while walking but couldn't even move leg minimally while sitting).  Lab  Results: LabObservation:  13-Mar-15 08:11   OBSERVATION Reason for Test  Hepatic:  12-Mar-15 21:50   Bilirubin, Total 0.3  Alkaline Phosphatase 52 (45-117 NOTE: New Reference Range 06/02/13)  SGPT (ALT) 18  SGOT (AST) 27  Total Protein, Serum 7.5  Albumin, Serum 3.5  Routine Chem:  12-Mar-15 21:50   Glucose, Serum 75  BUN 13  Creatinine (comp) 0.82  Sodium, Serum 138  Potassium, Serum 4.3  Chloride, Serum  109  CO2, Serum 24  Calcium (Total), Serum 8.6  Osmolality (calc) 274  eGFR (African American) >60  eGFR (Non-African American) >60 (eGFR values <30m/min/1.73 m2 may be an indication of chronic kidney disease (CKD). Calculated eGFR is useful in patients with stable renal function. The eGFR calculation will not be reliable in acutely ill patients when serum creatinine is changing rapidly. It is not useful in  patients on dialysis. The eGFR calculation may not be applicable to patients at the low and high extremes of body sizes, pregnant women, and vegetarians.)  Result Comment POTASSIUM/AST - Slight hemolysis, interpret results with  - caution.  Result(s) reported on 21 Sep 2013 at 10:16PM.  Anion Gap  5  13-Mar-15 04:40   Cholesterol, Serum 149  Triglycerides, Serum 74  HDL (INHOUSE) 43  VLDL Cholesterol Calculated 15  LDL Cholesterol Calculated 91 (Result(s) reported on 22 Sep 2013 at 05:57AM.)  Urine Drugs:  [id number]  Tricyclic Antidepressant, Ur Qual (comp) NEGATIVE (Result(s) reported on 22 Sep 2013 at 12:38AM.)  Amphetamines, Urine Qual. POSITIVE  MDMA, Urine Qual. NEGATIVE  Cocaine Metabolite, Urine Qual. NEGATIVE  Opiate, Urine qual NEGATIVE  Phencyclidine, Urine Qual. NEGATIVE  Cannabinoid, Urine Qual. NEGATIVE  Barbiturates, Urine Qual. NEGATIVE  Benzodiazepine, Urine Qual. NEGATIVE (----------------- The URINE DRUG SCREEN provides only a preliminary, unconfirmed analytical test result and should not be used for non-medical  purposes.   Clinical consideration and professional judgment should be  applied to any positive drug screen result due to possible interfering substances.  A more specific alternate chemical method must be used in order to obtain a confirmed analytical result.  Gas chromatography/mass spectrometry (GC/MS) is the preferred confirmatory method.)  Methadone, Urine Qual. NEGATIVE  Cardiac:  12-Mar-15 21:50   Troponin I < 0.02 (0.00-0.05 0.05 ng/mL or less: NEGATIVE  Repeat testing in 3-6 hrs  if clinically indicated. >0.05 ng/mL: POTENTIAL  MYOCARDIAL INJURY. Repeat  testing in 3-6 hrs if  clinically indicated. NOTE: An increase or decrease  of 30% or more on serial  testing suggests a  clinically important change)  Routine UA:  12-Mar-15 21:08  Color (UA) Yellow  Clarity (UA) Hazy  Glucose (UA) Negative  Bilirubin (UA) Negative  Ketones (UA) Negative  Specific Gravity (UA) 1.021  Blood (UA) 3+  pH (UA) 5.0  Protein (UA) Negative  Nitrite (UA) Negative  Leukocyte Esterase (UA) Negative (Result(s) reported on 21 Sep 2013 at 09:57PM.)  RBC (UA) 40 /HPF  WBC (UA) 1 /HPF  Bacteria (UA) NONE SEEN  Epithelial Cells (UA) 2 /HPF  Mucous (UA) PRESENT  Hyaline Cast (UA) 1 /LPF (Result(s) reported on 21 Sep 2013 at 09:57PM.)  Routine Hem:  12-Mar-15 21:08   WBC (CBC) 4.8  RBC (CBC) 4.52  Hemoglobin (CBC) 12.6  Hematocrit (CBC) 39.6  Platelet Count (CBC) 257  MCV 88  MCH 27.9  MCHC  31.8  RDW  14.9  Neutrophil % 58.3  Lymphocyte % 32.1  Monocyte % 7.8  Eosinophil % 1.0  Basophil % 0.8  Neutrophil # 2.8  Lymphocyte # 1.5  Monocyte # 0.4  Eosinophil # 0.0  Basophil # 0.0 (Result(s) reported on 21 Sep 2013 at 09:23PM.)   Radiology Results: [place]:    13-Mar-15 09:10, US Carotid Doppler Bilateral  US Carotid Doppler Bilateral   REASON FOR EXAM:    cva  COMMENTS:       PROCEDURE: [place]  - US CAROTID DOPPLER BILATERAL  - Sep 22 2013  9:10AM     CLINICAL DATA:   CVA.    EXAM:  BILATERAL CAROTID DUPLEX ULTRASOUND    TECHNIQUE:  Pearline Cables scale imaging, color Doppler and duplex ultrasound were  performed of bilateral carotid and vertebral arteries in the neck.    COMPARISON:  CT HEAD W/O CM dated 09/21/2013  FINDINGS:  Criteria: Quantification of carotid stenosis is based on velocity  parameters that correlate the residual internal carotid diameter  with NASCET-based stenosis levels, using the diameter of the distal  internal carotid lumen as the denominator for stenosis measurement.    The following velocity measurements were obtained:    RIGHT    ICA:  99/29 cm/sec    CCA:  37/85 cm/sec    SYSTOLIC ICA/CCA RATIO:  1.3  DIASTOLIC ICA/CCA RATIO:  1.2    ECA:  114 cm/sec    LEFT    ICA:  93/27 cm/sec    CCA:  885/02 cm/sec    SYSTOLIC ICA/CCA RATIO:  0.9    DIASTOLIC ICA/CCA RATIO:  0.9    ECA:  77 cm/sec  RIGHT CAROTID ARTERY:Minimal atherosclerotic plaque noted at the  bifurcation. No flow limiting stenosis. Waveforms normal.    RIGHT VERTEBRAL ARTERY:  Patent with antegrade flow.    LEFT CAROTID ARTERY: No significant plaque. No flow limiting  stenosis. Waveforms normal.    LEFT VERTEBRAL ARTERY:  Patent with antegrade flow .     IMPRESSION:  1. Mild atherosclerotic plaque right carotid bifurcation. No flow  limiting stenosis. Degree of stenosis less than 50%.  2. Left carotid widely patent. Vertebrals are patent with antegrade  flow.  Electronically Signed    By: Marcello Moores  Register    On: 09/22/2013 09:31         Verified By: Osa Craver, M.D., MD  CT:    12-Mar-15 21:18, CT Head Without Contrast  CT Head Without Contrast   REASON FOR EXAM:    stroke like symptoms  COMMENTS:       PROCEDURE: CT  - CT HEAD WITHOUT CONTRAST  - Sep 21 2013  9:18PM     CLINICAL DATA:  Right  facial paresthesias, weakness, stroke symptoms    EXAM:  CT HEAD WITHOUT CONTRAST    TECHNIQUE:  Contiguous axial images were  obtained from the base of the skull  through the vertex without contrast.    COMPARISON:  08/30/2011  FINDINGS:  Normal appearance of the intracranial structures. No evidence for  acute hemorrhage, mass lesion, midline shift, hydrocephalus or large  infarct. No acute bony abnormality. The visualized sinuses are  clear.     IMPRESSION:  No acute intracranial abnormality.      Electronically Signed    By: Daryll Brod M.D.    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 if neg and turns out that pt has functional weakness - pt might still benefit from PT/OT/ST eval and treatment.pt was explained on conversion disorder briefly and gave a reference of [email or web address].org pt was informed about mirror therapy. pituitary irregularity - no adenoma - stable from past - will monitor will check out to covering neurologist.  Electronic Signatures: Ray Church (MD)  (Signed 13-Mar-15 23:21)  Authored: Primary Care Physician, Consult, History of Present Illness, ALLERGIES, NURSING VITAL SIGNS, Physical Exam-, LAB RESULTS, RADIOLOGY RESULTS, Recommendations   Last Updated: 13-Mar-15 23:21 by Ray Church (MD)

## 2014-11-03 NOTE — H&P (Signed)
PATIENT NAME:  Mary Estrada, NOWOTNY MR#:  [id number] DATE OF BIRTH:  22-Jul-1965  DATE OF ADMISSION:  09/21/2013  REFERRING PHYSICIAN: Dr. Lisa Roca.   PRIMARY CARE PHYSICIAN: Dr. Kingsley Spittle.   CHIEF COMPLAINT: Right-sided weakness.   HISTORY OF PRESENT ILLNESS: A 49 year old African American female with past medical history of TIAs, as well as migraines and self reported pituitary tumor in the past, presenting with acute onset of right-sided weakness. Describes 1 day's duration of right-sided weakness which occurred the morning of admission, as well as associated slurred speech. The patient is, however, a remarkably poor historian. Unable to provide any further information that can be deemed reliable. She has a family member at bedside who confirms the details of her story, saying that she had weakness and slurred speech this morning which has been essentially stable and has been interfering with her ambulation. No fevers, chills, chest pain, palpitations, headache, shortness of breath or other symptomatology.   REVIEW OF SYSTEMS: Difficult to obtain given the patient's unwillingness to cooperate.  CONSTITUTIONAL: Denies fevers, chills. Positive for fatigue.  EYES: Denies blurred vision, double vision, eye pain.  ENT: Denies tinnitus, ear pain, hearing loss.  RESPIRATORY: Denies cough, wheeze, shortness of breath.  CARDIOVASCULAR: Denies chest pain, palpitations, edema.  GASTROINTESTINAL: Denies nausea, vomiting, diarrhea, abdominal pain.  GENITOURINARY: Denies dysuria or hematuria.  ENDOCRINE: Denies nocturia or thyroid problems.  HEMATOLOGIC AND LYMPHATIC: Denies easy bruising or bleeding.  SKIN: Denies rashes or lesions.  MUSCULOSKELETAL: Denies pain in neck, back, shoulder, knees, hips or arthritic symptoms.  NEUROLOGIC: Positive for weakness on the right side, as well as slurred speech. Denies any tremors, ataxia or headaches.  PSYCHIATRIC: Denies anxiety or depressive symptoms.    Otherwise, full review of systems performed by me is negative.   PAST MEDICAL HISTORY: TIA x 2, migraine, history of pituitary tumor.   SOCIAL HISTORY: Denies alcohol, tobacco or drug usage.   FAMILY HISTORY: Positive for CVAs, diabetes, hypertension.   ALLERGIES: PREDNISONE, REGLAN AND TAPE.   HOME MEDICATIONS: She states that she is taking no medications   PHYSICAL EXAMINATION:  VITAL SIGNS: Temperature 97.7, heart rate 99, respirations 18, blood pressure , saturating 100% on room air. Weight 90.7 kg, BMI 31.4.  GENERAL: Well-nourished, well-developed, African American female, currently in no acute distress.  HEAD: Normocephalic, atraumatic.  EYES: Pupils equal, round and reactive to light. Extraocular muscles intact. No scleral icterus; however, she has scleral injection bilaterally.  MOUTH: Moist mucosal membranes. Dentition intact. No abscess noted.  EARS, NOSE, THROAT: Throat clear without exudates. No external lesions.  NECK: Supple. No thyromegaly. No nodules. No JVD.  PULMONARY: Clear to auscultation bilaterally without wheezes, rubs or rhonchi. No use of accessory muscles. Good respiratory effort.   CHEST: Nontender to palpation.  CARDIOVASCULAR: S1, S2, regular rate and rhythm. No murmurs, rubs or gallops. No edema. Pedal pulses 2+ bilaterally.  GASTROINTESTINAL: Soft. nontender, nondistended. No masses. Positive bowel sounds. No hepatosplenomegaly.  MUSCULOSKELETAL: No swelling, clubbing or edema. Range of motion limited in right lower extremity secondary to weakness. Otherwise, full range of motion intact in all other extremities.  NEUROLOGICAL: Cranial nerves II through XII intact. Reflexes intact. Sensation over the right side diminished upper and lower extremities. She has a mild positive pronator drift on the right. Strength deficit in the right upper extremity 4 out of 5 in both proximal and distal flexor and extensor hand grip, 3 out of 5 on the right compared to the  left. Right lower  extremity strength 3 out of 5 in both proximal and distal flexor and extension muscle groups when compared to the left. Gait deferred at this time.  SKIN: No ulcerations, lesions, rashes, cyanosis. Skin warm, dry. Turgor intact.  PSYCHIATRIC: Mood and affect blunted. She is somnolent, though easily arousable. She will appropriately answer questions when prompted multiple times. Insight and judgment appear poor.   LABORATORY DATA: EKG performed. Normal sinus rhythm. No ST or T wave abnormalities. CT head performed revealing no acute intracranial process. interestingly enough that MRI of pituitary performed 1 day prior to symptoms which revealed slight asymmetry of the left side of the pituitary without a discrete mass, and this is a stable finding. Otherwise, normal MRI of the brain. Remainder of laboratory data: Sodium 138, potassium 4.3, chloride 109, bicarb 24, BUN 13, creatinine 0.82, glucose 75. LFTs within normal limits. WBC 4.8, hemoglobin 12.6, platelets 257. Urinalysis negative for evidence of infection.   ASSESSMENT AND PLAN: A 49 year old [place] American female with history of transient ischemic attacks and migraines as well as pituitary tumor, presenting with acute onset of right-sided weakness.  1. Cerebrovascular accident: She received aspirin therapy thus far. Will continue aspirin. Add statin therapy. Will check an MRI, transthoracic echocardiogram, lipid panel, carotid Doppler. Neurology checks q.4 hours. She will require a swallow assess if not already done. When passed,can tolerate diet. Permissive hypertension, treating blood pressure only if greater than 220/120 or symptomatic. At that time, will use hydralazine. Will avoid heparin for the first 24 to 48 hours.  2. Venous thromboembolism prophylaxis with sequential compression devices.   The patient is FULL CODE.   TIME SPENT: 45 minutes.   ____________________________ Aaron Mose. Khylan Sawyer, MD dkh:gb D: 09/21/2013  23:09:47 ET T: 09/21/2013 23:37:51 ET JOB#: [id number]  cc: Aaron Mose. Britanie Harshman, MD, <Dictator> Michall Noffke Woodfin Ganja MD ELECTRONICALLY SIGNED 09/22/2013 20:32

## 2014-11-04 NOTE — H&P (Signed)
PATIENT NAME:  Mary Estrada, Mary Estrada MR#:  [id number] DATE OF BIRTH:  12/06/1965  DATE OF ADMISSION:  08/30/2011  PRIMARY CARE PHYSICIAN:  Dr. Ellin Goodie   CHIEF COMPLAINT:  Numbness in the right arm.   HISTORY OF PRESENT ILLNESS: This is a 49 year old female with past medical history of migraines, pituitary tumor, and two transient ischemic attacks. She presents with numbness on the right arm and hand that has been going on for the past day, constant since last night. No complaints of neck pain. She has been dropping stuff and does feel weak, also feeling some weakness in the right leg. She has frequent migraines. This morning she had a migraine when she woke up and took Fioricet. It went away, but now it is coming back. Yesterday she did not have a migraine. In the Emergency Room she had a CT scan of the head that was negative. The patient has not had an MRI of the brain for a while for her pituitary tumor and is supposed to be referred by Dr. Ellin Goodie over to [facility] neurology for further evaluation. Hospitalist services were contacted for further evaluation.   PAST MEDICAL HISTORY:  1. Migraines.  2. Irritable bowel syndrome,  3. Pituitary tumor.  4. Recent finding of decreased peripheral vision in the right eye. 5. Two cysts on the liver. 6. One cyst to the bile duct. 7. Interstitial cystitis.  8. Two transient ischemic attacks, 2006 and 2008. Also at that time right arm weakness.  9. Thyroid cyst and goiter.    PAST SURGICAL HISTORY:  1. Tubal ligation.  2. Hysterectomy.  3. Cholecystectomy.  4. Tonsillectomy.  5. Benign cyst to left breast. 6. LASIK surgery.   ALLERGIES: Reglan and prednisone.   MEDICATIONS:  1. Fioricet p.r.n.  2. Bactrim single strength daily.  3. Aspirin 81 mg daily.  4. B12 shot IM monthly and 250 mcg daily.  5. Multivitamin.   SOCIAL HISTORY: No smoking. No alcohol. No drug use. She is a full time Production assistant, radio.   FAMILY HISTORY:  Father died at 57 of a cerebrovascular accident and had hypertension. Mother with diabetes and hypertension. Sister with hypertension and diabetes.   REVIEW OF SYSTEMS: CONSTITUTIONAL: Positive for sweats. Positive for weight gain in the past six months, 30 pounds. Positive for right arm weakness. EYES: Decreased peripheral vision of the right eye. EARS, NOSE, MOUTH, AND THROAT:  Positive for dysphagia after cerebrovascular accident. CARDIOVASCULAR: No chest pain. No palpitations. RESPIRATORY: No shortness of breath. No coughing. No sputum. No hemoptysis. GASTROINTESTINAL: Positive for nausea, occasional abdominal pain. Positive for constipation. No bright red blood per rectum. No melena. GENITOURINARY: No burning on urination or hematuria. MUSCULOSKELETAL: No joint pain or muscle pain. INTEGUMENT: No rashes or eruptions. NEUROLOGIC: No fainting or blackouts. PSYCHIATRIC: No anxiety or depression. ENDOCRINE: History of thyroid cyst and goiter. HEMATOLOGIC/LYMPHATIC: No anemia, no easy bruising or bleeding.   PHYSICAL EXAMINATION:  VITAL SIGNS: Temperature 97.7, pulse 75, respirations 19, blood pressure 132/75, pulse oximetry 100%.   GENERAL: No respiratory distress.   EYES: Conjunctivae and lids normal. Pupils equal, round, and reactive to light. Extraocular muscles intact. No nystagmus.  Gross visual field testing with decreased right peripheral vision.  EARS, NOSE, MOUTH, AND THROAT: Nasal mucosa no erythema. Throat no erythema. No exudate seen. Lips and gums normal.   NECK: No JVD. No bruits. No lymphadenopathy. Positive for thyromegaly. No cyst or nodules felt by me.   RESPIRATORY: Equal breath sounds bilaterally. No  rhonchi, rales, or wheeze heard. No use of accessory muscles to breathe.   HEART: S1, S2 normal. No gallops, rubs, or murmurs heard. Carotid upstroke 2+ bilaterally. No bruits.   EXTREMITIES: Dorsalis pedis pulses 2+ bilaterally. No edema of the lower extremity.   ABDOMEN:   Soft, nontender. No organomegaly/splenomegaly. Normoactive bowel sounds. No masses felt.   LYMPHATIC: No lymph nodes in the neck.   MUSCULOSKELETAL: No clubbing, edema, or cyanosis.   SKIN: No rashes or ulcers seen.   NEUROLOGIC: Cranial nerves II through XII grossly intact except for visual fields decreased in the right eye. Deep tendon reflexes are 2+ bilateral lower extremities. Power right side 4/5 upper and lower extremities. Left side, 5/5 upper and lower extremities. Babinski negative bilaterally. Sensation to light touch grossly intact. Sensation as per patient decreased on the right.   PSYCHIATRIC: The patient is oriented to person, place, and time.   LABORATORY, DIAGNOSTIC, AND RADIOLOGICAL DATA: CT scan of the head showed no acute intracranial abnormality. Glucose 87, BUN 15, creatinine 0.92, sodium 143, potassium 3.9, chloride 104, CO2 27, calcium 8.8. Liver function tests normal. White blood cell count 4.8, hemoglobin and hematocrit 13.3 and 39.1, platelet count 294. Troponin negative.   ASSESSMENT AND PLAN:  1. We will admit as an acute cerebrovascular accident with right-sided weakness: We will get an MRI of the brain, echocardiogram with bubble study, and get carotid ultrasound. We will check lipids and start Zocor. Since the patient takes an aspirin and has a history of two transient ischemic attacks, we will take a step up to Aggrenox 1 tablet twice a day. We will get physical therapy and occupational therapy to see the patient.  2. History of pituitary tumor with grossly right-sided peripheral vision loss. We will get an MRI of the brain with thin cuts through the pituitary. We will check a prolactin level, IGF-I, ACTH, TFTs, LH, and FSH levels to check activity of the pituitary. 3. Migraine history: The patient is on Fioricet.  4. Interstitial cystitis: On Bactrim.  5. Check lipid profile and give Zocor.  6. Constipation: We will start Colace.  TIME SPENT ON ADMISSION: 55  minutes.   ____________________________ Tana Conch. Leslye Peer, MD [id number] D: 08/30/2011 15:11:47 ET T: 08/30/2011 15:38:43 ET JOB#: [id number]  cc: Tana Conch. Leslye Peer, MD, <Dictator> Ellamae Sia, MD Marisue Brooklyn MD ELECTRONICALLY SIGNED 08/30/2011 20:58

## 2014-11-04 NOTE — Consult Note (Signed)
PATIENT NAME:  Mary Estrada, Mary Estrada MR#:  [id number] DATE OF BIRTH:  09/13/1965  DATE OF CONSULTATION:  08/31/2011  REFERRING PHYSICIAN:  Loletha Grayer, MD  CONSULTING PHYSICIAN:  Rudell Cobb. Loletta Specter, MD  HISTORY: Ms. 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 Kingsley Spittle of the [facility], formerly a patient of Dr. Alda Ponder of Neurology in [place], with reported history of migraine headaches, pituitary tumor diagnosed in 2000, 2006, and 2008, TIAs, supplemented Vitamin B12 deficiency, irritable bowel syndrome, interstitial cystitis, thyroid cyst and goiter, liver cysts (two), and moderate obesity. She was admitted 08/30/2011 and is referred for evaluation of right side weakness and migraines. History comes from the patient and from her hospital chart.   The patient presented to the Emergency Room at 11:15 a.m. on 08/30/2011 with concern regarding numbness of the right hand and arm beginning the day before. She reports the onset approximately 6:30 p.m. on 08/29/2011 of tingling of the right hand and arm. At 11 a.m. on 08/30/2011 while cooking, she reports that she was "not able to hold onto anything" with her right hand secondary to weakness and decreased feeling. Tingling had faded. When she was seen the evening of 08/31/2011, she reported that the right hand and arm still had less feeling and weak. She reports that her right leg has not had recent symptoms, that it can occasionally want to give way since TIA in 2006, last gave way in 2011. She reports right eye vision somewhat out of focus beginning "a couple of days ago".   She reports migraine headaches beginning in 8th grade, headaches occurring approximately twice a month until they became daily for 4 to 5 years until 2 or 3 months ago when they average about one a week, but now have occurred daily for the past two weeks. She reports that she has not had benefit with Relpax or  Imitrex, but that she has had help taking two Fioricet in the past year. She has been taking two Fioricet a day for the past week. She reports that headaches changed from daily for four or five years to about once a week a few months ago "when I got rid of my boyfriend and my husband".   She reports a two admission to [facility] in 2006 for stroke when she developed "no use" to the right upper extremity and the right lower extremity, starting with pain in the right hand. She reports that this episode occurred approximately a week after she had had surgery for deviated septum. She reports in 2008 she developed a problem with use of her legs, noticed first when she began to walk down stairs, and that she was seen by her neurologist and had testing including nerve conduction and EMG testing which she reports indicated that she was losing nerves in her arms and legs. She reports that her problem at that time improved over approximately a year.   She underwent MRI scan of the brain and optic nerves which has not shown any evidence of pituitary tumor or other abnormality. She reports that pituitary tumor was diagnosed with CT and MRI scan imaging in 2000.   PHYSICAL EXAMINATION: On examination the patient is an overweight African American woman who was examined lying semisupine, in no apparent distress. Her blood pressure was 115/70 and heart rate 72. She was normocephalic without evidence of trauma and her neck was supple. She was noted to have crowded posterior pharyngeal space  and have a generous tongue. Mental status was normal, though cognitive testing was not performed in detail. She was alert and oriented with clear speech and normal expression and was lucid and a good historian with normal affect. Cranial nerve examination was normal with the exception of subjective loss of right eye temporal visual fields sparing central vision. Motor examination of the extremities showed normal tone  and bulk and no upper extremity pronator drift. There was normal strength proximally and distally in the left arm and leg. Testing on the right showed variable power with early give way of all muscle groups in the arm and leg, strength greater than or equal to 4+ out of 5 throughout. On coordination examination, movements of the right side were slowed without dystaxia. Reflexes were symmetric and rated 2+ throughout. Her gait was not tested.   IMPRESSION:  1. Possible history of migraine headaches beginning in the 8th grade.  2. I suspect that frequent and at times daily headaches in recent years may represent tension-type headaches exacerbated by stress and also exacerbated by rebounding effect of daily use of medication such as Fioricet.  3. Recent right side symptoms with functional examination today, suggestive of conversion reaction or stress reaction.  4. Possible carpal tunnel syndrome in the setting of right hand numbness symptoms.  5. I suspect that she may have significant obstructive sleep apnea; she reports that she had a sleep study "years ago" which did not show sleep apnea, that she was not as heavy at that time.  6. 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   RECOMMENDATIONS:  1. I agree with her present work-up in hospital including imaging and laboratory studies.  2. Records will be sought from [facility].  3. Consider evaluation by Psychiatry with regard to stressors and suspected conversion reaction.   I appreciate being asked to see this pleasant and interesting lady. I will follow-up on pursuit of her prior medical records.   ____________________________ Rudell Cobb. Loletta Specter, MD prc:drc D: 09/01/2011 07:50:53 ET T: 09/01/2011 09:25:34 ET JOB#: [id number]  cc: Rudell Cobb. Loletta Specter, MD, <Dictator> Linton Flemings MD ELECTRONICALLY SIGNED 09/10/2011 17:58

## 2014-11-04 NOTE — Discharge Summary (Signed)
PATIENT NAME:  Mary Estrada, Mary Estrada MR#:  [id number] DATE OF BIRTH:  [birth year]  DATE OF ADMISSION:  08/30/2011 DATE OF DISCHARGE:  09/02/2011  ADMITTING PHYSICIAN: Mary Grayer, MD  DISCHARGING PHYSICIAN: Mary Lighter, MD  PRIMARY CARE PHYSICIAN: Mary Goodie, MD  CONSULTANTS: 1. Mary Shelling, MD - Psychiatry. 2. Mary Corner, MD - Neurology.  DISCHARGE DIAGNOSES:  1. Right-sided weakness and numbness. Neurology work-up is negative, likely conversion disorder.  2. Tension headaches and also rebound headaches. 3. History of migraine.  4. The patient says she has a pituitary tumor, but MRI is negative currently and also negative in the past.  5. Hyperlipidemia.  6. Irritable bowel syndrome.  7. Interstitial cystitis.  DISCHARGE HOME MEDICATIONS: 1. Vitamin B-2 100 mcg p.o. daily.  2. Aspirin 81 mg p.o. daily.  3. Multivitamin 1 tablet p.o. daily.  4. Lipitor 20 mg p.o. daily.  5. Flexeril 10 mg p.o. every 8 hours p.r.n.  6. Norco 5/325 mg 1 tablet p.o. every 6 hours p.r.n.   DISCHARGE DIET: Low-sodium diet.   DISCHARGE ACTIVITY: As tolerated.  FOLLOWUP INSTRUCTIONS:  1. Follow up with Dr. Michaela Estrada in two weeks.  2. Psych follow up as recommended at [facility]. 3. Ophthalmology follow up for right eye vision changes in 1 to 2 weeks.   LABS/IMAGING STUDIES: WBC 4.8, hemoglobin 12.4, hematocrit 37.2, platelet count 267. Sodium 141, potassium 3.8, chloride 103, bicarbonate 26, BUN 15, creatinine 0.73, glucose 83, calcium 8.9. LDL 124, HDL 67, total cholesterol 213, Serum triglycerides 112. Troponins are negative.  Chest x-ray showed no acute cardiopulmonary disease.   CT of the head without contrast showed no acute intracranial abnormality and no hemorrhage. No focal mass.   Ultrasound carotid Doppler showed minimal intimal thickening without evidence of hemodynamically significant stenosis.  MRI of the brain with pituitary cuts with and without  contrast showed a definite pituitary mass is not identified. There is a small amount of fluid in the right sphenoid sinus. The pituitary gland does not appear to encroach on the optic chiasm or the optic nerve. No discrete pituitary mass is appreciated.   [place] level 43.5 and lies in postmenopausal phase. LH level is 26.1 and lies within the ovulation phase. ACTH is 39.2. Insulin-like growth factor 162 ng/mL, which is within normal limits. Thyroxine level 6.4. TSH 2.75. Prolactin 47, which is elevated.  Echo Doppler showed normal left ventricular systolic function, ejection fraction greater than 55%, trace mitral regurgitation. No pericardial effusion.   MRI of C-spine showed unremarkable cervical spine MRI.   BRIEF HOSPITAL COURSE: Mary Estrada is a 49 year old African American female with past medical history of migraines and also diagnosed with conversion disorder presenting with intermittent right-sided weakness and numbness at different hospital, in 2006 and also 2010, who comes to [facility] with similar complaints of right hand and leg numbness and also weakness. She was also experiencing an excruciating headache for which she has been taking Fioricet for a long time.  1. Right-sided weakness: She did have a MRI which did not show any acute infarcts and her neurological examination does not correspond well to right-sided hemiplegia. Dr. Loletta Estrada from neurology has examined the patient and also has received records from [facility], from 2006, when she said she had a TIA, and also records from 2010, and confirmed that she presented with similar complaints at the time and she had a lot of stress in her life. They diagnosed her to have conversion disorder.  She even followed up with a neurologist after discharge who has discharged her from his service and recommended her to follow up with a psychiatrist and psychologist. Her MRI work-up, Doppler's, echocardiogram, and EMG  nerve conduction studies for the right arm and leg had been negative at that time. Even here the patient is not opposed to the information saying that this is not a stroke and this could be just psychological. The patient had a MRI of the C-spine done to rule out C-spine disease and that was negative too. She has been seen by Dr. Cephus Estrada who does say that she might have some subclinical findings, but no actual depression or anxiety and this still could be conversion disorder, but because she has a lot of stress in her life. Please see the consultation notes by Dr. Loletta Estrada and also Mary Estrada for more details. She did work with physical therapy and is slightly weaker on her right side, but she is able to walk around without any assistance or walker. Physical therapy has recommended home health PT, but because of the patient's insurance she has three days of PT approved for the whole year, according to the case manager. The patient says that her boyfriend can help her at home currently and agreed to go home.  2. Headaches: Initially thought to be migraine. She does have photophobia and right-sided visual changes. Dr. Loletta Estrada did ophthalmoscopic examination and did not find anything. The patient said that she had possibly a pituitary tumor in the past that was not treated. The MRI done with pituitary cuts does not show any new tumor and there was no evidence of any pituitary tumor mentioned in her prior MRI. So not sure if she thinks she has a tumor as part of conversion syndrome and this has been explained to her that she does not have a pituitary tumor. Either way her visual disturbance does not seem to be related to the brain and she was recommended to follow up with an ophthalmologist as an outpatient. Her headaches are most likely rebound headaches in a combination with tension headaches. The way she is presenting, it does not look like migraine headache. She has been on Valtrex and Imitrex in the past with no  benefit. Dr. Loletta Estrada has not suggested starting her on any maintenance medication because he does not think this is migraine. She has been taking several doses of Fioricet in the past and kind of having rebound headaches from not taking that. She is slowly being weaned off Fioricet in the hospital. She was getting Narco and Flexeril p.r.n. for pain. She will continue to take the same for the next week and taper them off. She will be following up with neurology as an outpatient and also with [facility].  3. Hyperlipidemia: She was found to have LDL of 124. Although in the low risk region with  questionable weakness, she was started on a statin and can continue taking that at home. Her course has been otherwise uneventful in the hospital.   DISCHARGE CONDITION: Stable.   DISCHARGE DISPOSITION: Home.   TIME SPENT ON DISCHARGE: 40 minutes. ____________________________ Mary Lighter, MD rk:slb D: 09/02/2011 65:53:74 ET T: 09/03/2011 10:27:40 ET JOB#: [id number]  cc: Mary Lighter, MD, <Dictator> Ellamae Sia, MD Rudell Cobb. Mary Specter, MD Mary Lighter MD ELECTRONICALLY SIGNED 09/04/2011 15:54
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
## 2014-12-04 DIAGNOSIS — M7520 Bicipital tendinitis, unspecified shoulder: Secondary | ICD-10-CM

## 2014-12-04 DIAGNOSIS — M7541 Impingement syndrome of right shoulder: Secondary | ICD-10-CM | POA: Insufficient documentation

## 2014-12-04 HISTORY — DX: Bicipital tendinitis, unspecified shoulder: M75.20

## 2015-01-24 IMAGING — CT CT STONE STUDY
1 of 2 series · 15 of 32 positions shown, 19 images · non-contrast
Comparison: none

REASON FOR EXAM: r flank pain
COMMENTS:

[Series 2: 3mm soft tissue · axial · 0.68mm/px · z∈[-479,-62]mm · 15 of 153 slices shown, 19 images]
[im 7/153  soft-tissue]
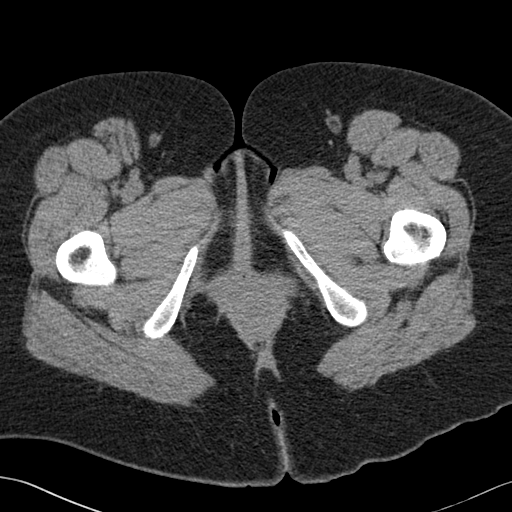
[im 7/153  bone]
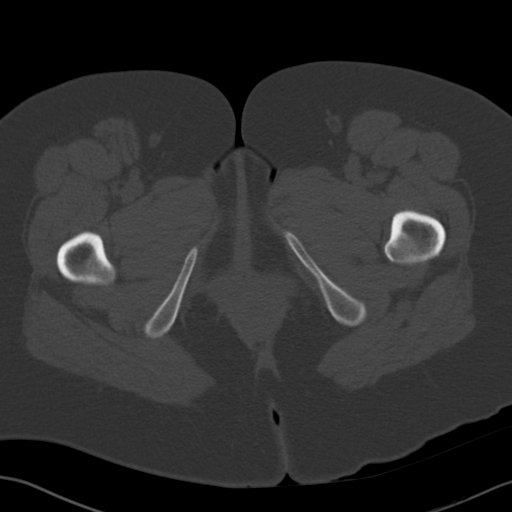
[im 19/153  soft-tissue]
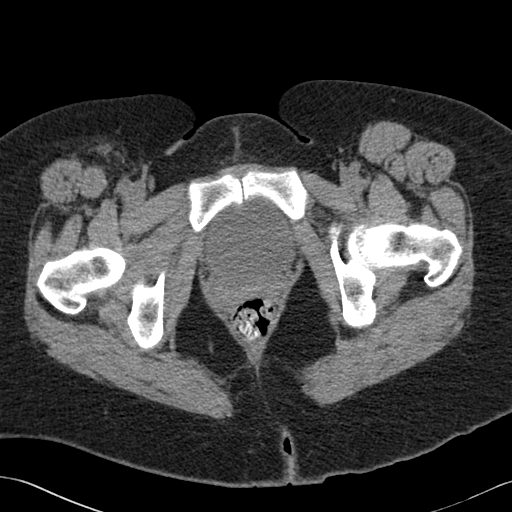
[im 31/153  soft-tissue]
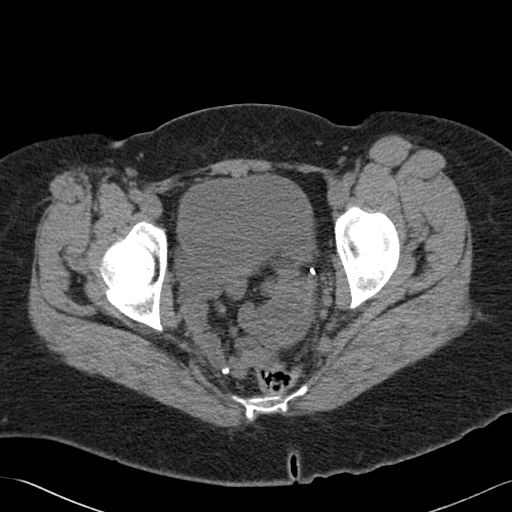
[im 43/153  soft-tissue]
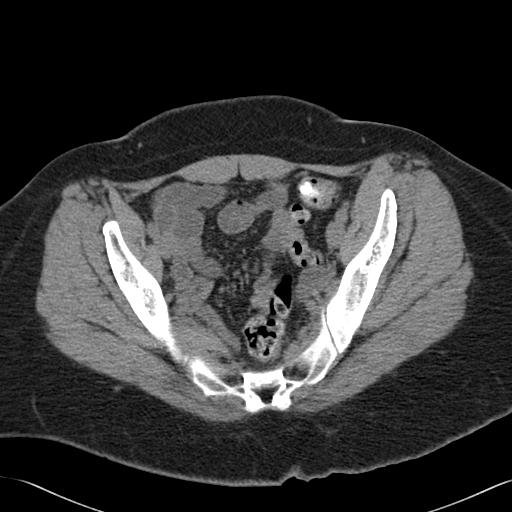
[im 55/153  soft-tissue]
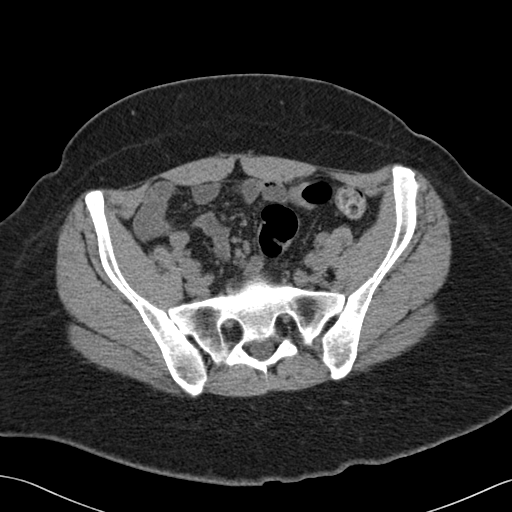
[im 67/153  soft-tissue]
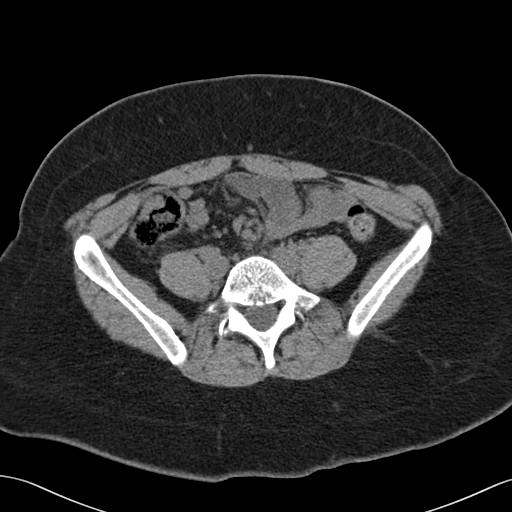
[im 80/153  soft-tissue]
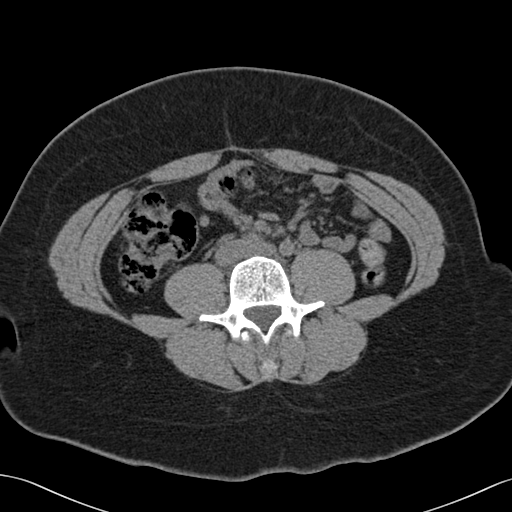
[im 86/153  soft-tissue]
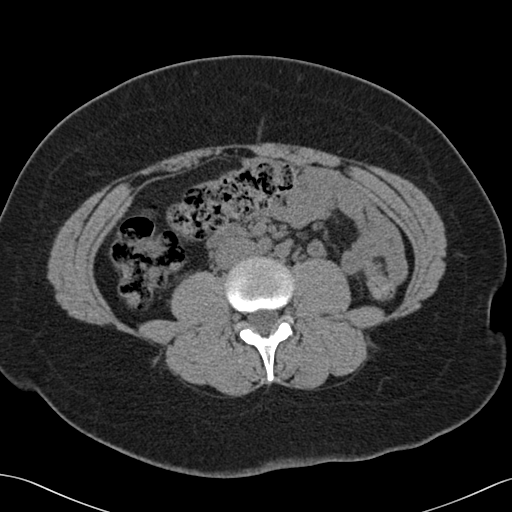
[im 98/153  soft-tissue]
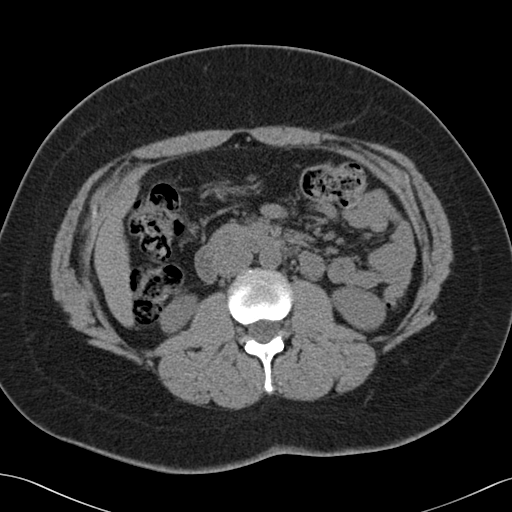
[im 98/153  bone]
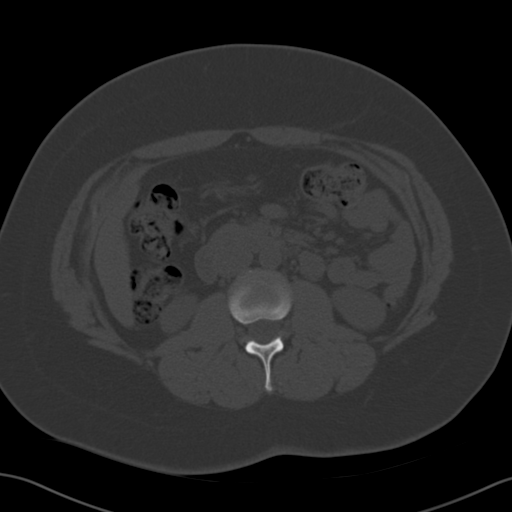
[im 110/153  soft-tissue]
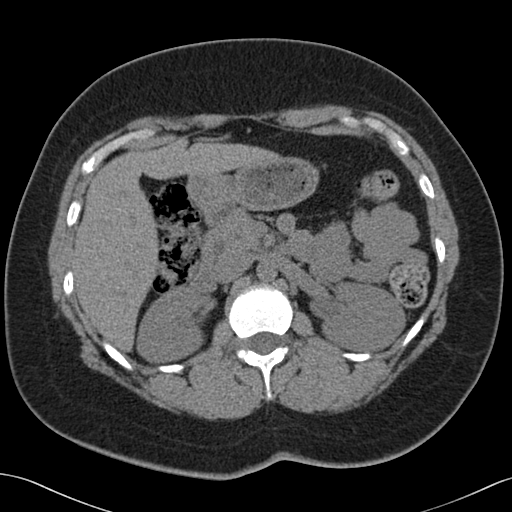
[im 122/153  soft-tissue]
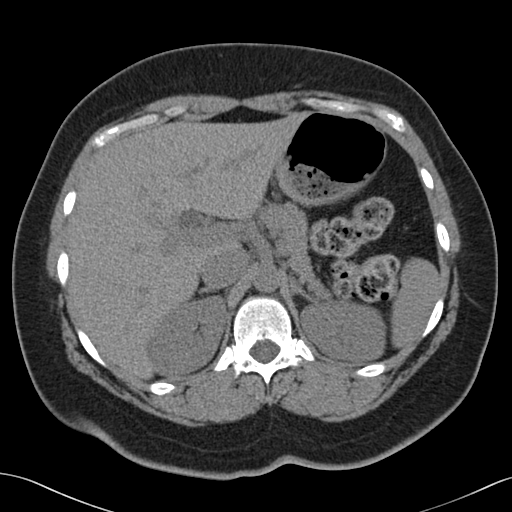
[im 128/153  lung]
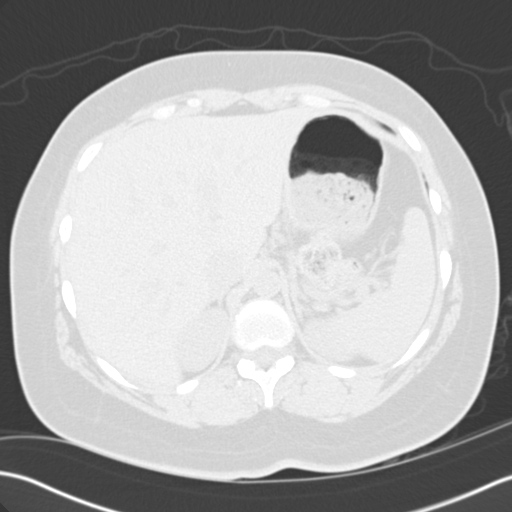
[im 134/153  soft-tissue]
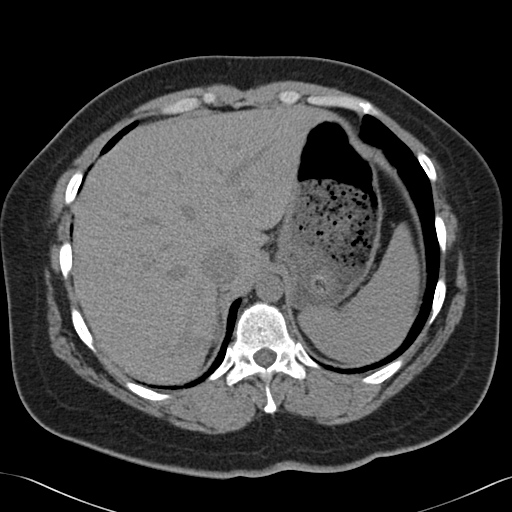
[im 134/153  lung]
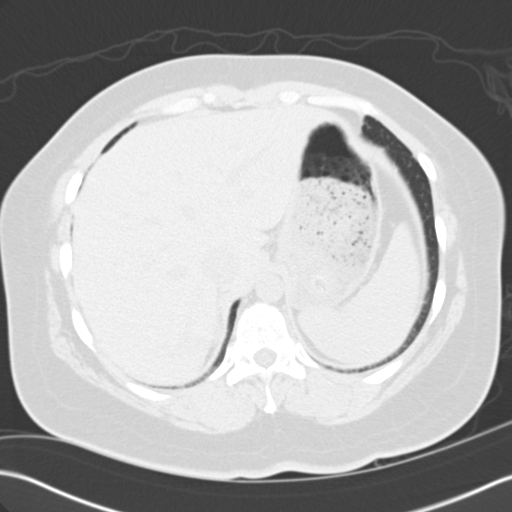
[im 140/153  lung]
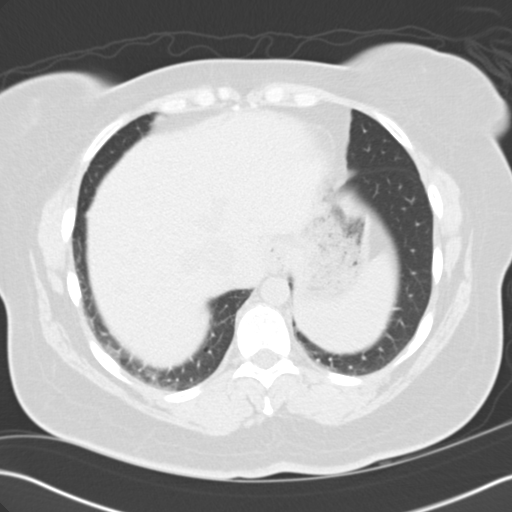
[im 146/153  soft-tissue]
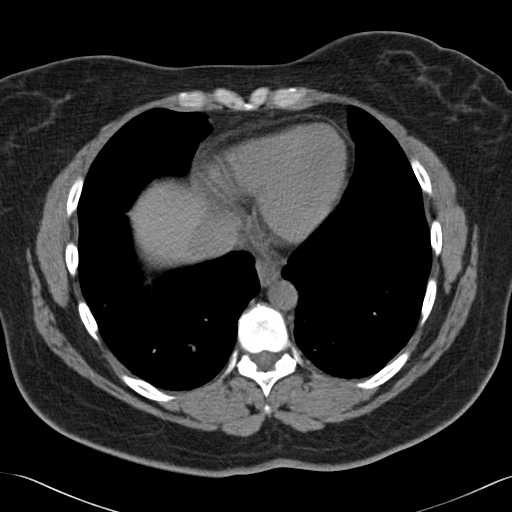
[im 146/153  lung]
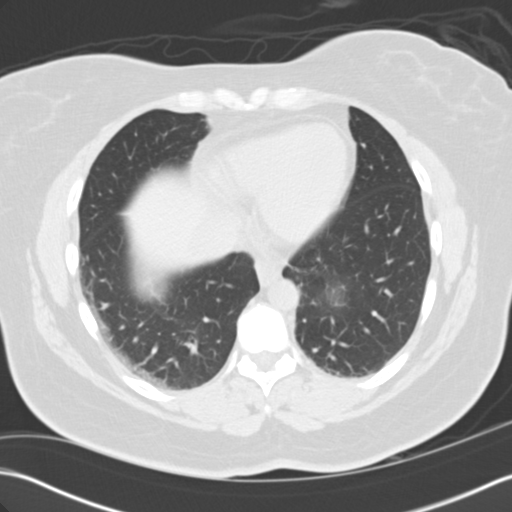

[15 of 32 positions shown; findings below may reference images not displayed]

PROCEDURE:     CT  - CT ABDOMEN /PELVIS WO (STONE)  - September 03, 2012 [DATE]

RESULT:     Axial noncontrast CT scanning was performed through the abdomen
and pelvis with reconstructions at 3 mm intervals and slice thicknesses.
Review of multiplanar reconstructed images was performed separately on the
VIA monitor.

The gallbladder is surgically absent. The liver exhibits no focal mass or
significant ductal dilation. The pancreas, spleen, and adrenal glands are
normal in appearance. There is a moderate amount of fluid within the
stomach. The kidneys enhance exhibit normal contour and no evidence of
stones or obstruction or surrounding inflammatory change. The caliber of the
abdominal aorta is normal.

A normal calibered partially gas-filled uninflamed appearing appendix is
demonstrated on images 88 through 95. The unopacified loops of small and
large bowel exhibit no evidence of ileus nor of obstruction. A moderate
amount of stool is present within the colon. Within the pelvis the uterus is
surgically absent. No discrete adnexal masses are demonstrated. The patient
has undergone previous total abdominal hysterectomy. There is no free pelvic
fluid. The partially distended urinary bladder is normal in appearance.
There are phleboliths within the pelvis.

The psoas musculature is normal in appearance. The lumbar vertebral bodies
are preserved in height. The lung bases exhibit minimal compressive
atelectasis.
IMPRESSION: 1. There is no evidence of acute appendicitis nor diverticulitis.
2. There is no evidence of urinary tract stones nor obstruction or acute
inflammation.
3. There is a moderate amount of stool within the colon without evidence of
obstruction. I cannot exclude an element of constipation in the appropriate
clinical setting.

A preliminary report was sent to the [HOSPITAL] the conclusion
of the study.

[REDACTED]

## 2015-06-17 ENCOUNTER — Encounter: Payer: Self-pay | Admitting: Emergency Medicine

## 2015-06-17 ENCOUNTER — Emergency Department: Payer: BC Managed Care – PPO

## 2015-06-17 ENCOUNTER — Emergency Department
Admission: EM | Admit: 2015-06-17 | Discharge: 2015-06-17 | Disposition: A | Discharge status: Home / Self Care | Payer: BC Managed Care – PPO | Attending: Emergency Medicine | Admitting: Emergency Medicine

## 2015-06-17 DIAGNOSIS — J40 Bronchitis, not specified as acute or chronic: Secondary | ICD-10-CM

## 2015-06-17 DIAGNOSIS — J209 Acute bronchitis, unspecified: Secondary | ICD-10-CM | POA: Insufficient documentation

## 2015-06-17 MED ORDER — IPRATROPIUM-ALBUTEROL 0.5-2.5 (3) MG/3ML IN SOLN
3.0000 mL | Freq: Once | RESPIRATORY_TRACT | Status: AC
  Administered 2015-06-17: 3 mL via RESPIRATORY_TRACT
  Filled 2015-06-17: qty 3

## 2015-06-17 MED ORDER — ALBUTEROL SULFATE HFA 108 (90 BASE) MCG/ACT IN AERS: 2.0000 | INHALATION_SPRAY | Freq: Four times a day (QID) | RESPIRATORY_TRACT | Status: DC | PRN

## 2015-06-17 MED ORDER — AZITHROMYCIN 250 MG PO TABS: ORAL_TABLET | ORAL | Status: AC

## 2015-06-17 NOTE — Discharge Instructions (Signed)

## 2015-06-17 NOTE — ED Notes (Signed)
Pt uprite on stretcher in exam room, bundled in blankets; voice hoarseness noted; Pt reports since Wednesday has had productive cough yellow sputum, fever, and sinus congestion; taking mucinex without relief; resp even/unlab, lungs clear

## 2015-06-17 NOTE — ED Notes (Signed)
Patient transported to X-ray 

## 2015-06-17 NOTE — ED Notes (Signed)
Pt c/o cough since Wednesday; now productive of dark yellow sputum; feeling short of breath after coughing spell; pain to the center of chest with cough; intermittent fever; pt with hoarse voice in triage

## 2015-06-17 NOTE — ED Provider Notes (Signed)
[facility] Emergency Department Provider Note  ____________________________________________  Time seen: On arrival  I have reviewed the triage vital signs and the nursing notes.   HISTORY  Chief Complaint Cough    HPI RENADA ROEPER is a 49 y.o. female presents with cough for approximately one week. She reports it initially began as a sore throat and then she developed laryngitis and cough. Her cough has become severe and she is productive of dark sputum. She does report objective fevers. No sick contacts. No recent travel. No calf pain.    History reviewed. No pertinent past medical history.  There are no active problems to display for this patient.   Past Surgical History  Procedure Laterality Date  . Abdominal hysterectomy    . Cholecystectomy    . Tonsillectomy    . Myomectomy      No current outpatient prescriptions on file.  Allergies Reglan and Prednisone  No family history on file.  Social History Social History  Substance Use Topics  . Smoking status: Never Smoker   . Smokeless tobacco: None  . Alcohol Use: No    Review of Systems  Constitutional: Positive for fever Eyes: Negative for discharge ENT: Positive for sore throat, now resolved   Genitourinary: Negative for dysuria. Musculoskeletal: Negative for back pain. Skin: Negative for rash. Neurological: Negative for headaches    ____________________________________________   PHYSICAL EXAM:  VITAL SIGNS: ED Triage Vitals  Enc Vitals Group     BP 06/17/15 0641 136/75 mmHg     Pulse Rate 06/17/15 0641 81     Resp 06/17/15 0641 20     Temp 06/17/15 0641 97.7 F (36.5 C)     Temp Source 06/17/15 0641 Oral     SpO2 06/17/15 0641 96 %     Weight 06/17/15 0641 196 lb (88.905 kg)     Height 06/17/15 0641 5\' 8"  (1.727 m)     Head Cir --      Peak Flow --      Pain Score --      Pain Loc --      Pain Edu? --      Excl. in [place]? --      Constitutional:  Alert and oriented. Well appearing and in no distress. Eyes: Conjunctivae are normal.  ENT   Head: Normocephalic and atraumatic.   Mouth/Throat: Mucous membranes are moist. Cardiovascular: Normal rate, regular rhythm. No murmurs Respiratory: Normal respiratory effort without tachypnea nor retractions. Scattered wheezes . Musculoskeletal: Nontender with normal range of motion in all extremities. Neurologic:  Normal speech and language. No gross focal neurologic deficits are appreciated. Skin:  Skin is warm, dry and intact. No rash noted. Psychiatric: Mood and affect are normal. Patient exhibits appropriate insight and judgment.  ____________________________________________    LABS (pertinent positives/negatives)  Labs Reviewed - No data to display  ____________________________________________     ____________________________________________    RADIOLOGY I have personally reviewed any xrays that were ordered on this patient: Chest x-ray unremarkable  ____________________________________________   PROCEDURES  Procedure(s) performed: none   ____________________________________________   INITIAL IMPRESSION / ASSESSMENT AND PLAN / ED COURSE  Pertinent labs & imaging results that were available during my care of the patient were reviewed by me and considered in my medical decision making (see chart for details).  Patient with upper respiratory infection/bronchitis for approximately one week. Chest x-ray unremarkable. Vitals unremarkable. Suspect viral etiology, we will give DuoNeb in the ED and reevaluate. Patient apparently has  allergy to prednisone.  ____________________________________________   FINAL CLINICAL IMPRESSION(S) / ED DIAGNOSES  Final diagnoses:  Bronchitis     Lavonia Drafts, MD 06/17/15 [phone]

## 2015-06-20 ENCOUNTER — Telehealth: Payer: Self-pay | Admitting: Gastroenterology

## 2015-06-20 NOTE — Telephone Encounter (Signed)
Called patient to schedule appointment for IBS notes unde [facility]. Patients voice mail was full and I couldn't leave message.

## 2015-09-05 ENCOUNTER — Ambulatory Visit: Payer: Self-pay | Admitting: Gastroenterology

## 2015-09-19 ENCOUNTER — Ambulatory Visit (INDEPENDENT_AMBULATORY_CARE_PROVIDER_SITE_OTHER): Payer: BLUE CROSS/BLUE SHIELD | Admitting: Gastroenterology

## 2015-09-19 ENCOUNTER — Encounter: Payer: Self-pay | Admitting: Gastroenterology

## 2015-09-19 ENCOUNTER — Ambulatory Visit: Payer: Self-pay | Admitting: Gastroenterology

## 2015-09-19 ENCOUNTER — Other Ambulatory Visit: Payer: Self-pay

## 2015-09-19 VITALS — BP 127/69 | HR 86 | Temp 97.7°F | Ht 68.0 in | Wt 194.0 lb

## 2015-09-19 DIAGNOSIS — D352 Benign neoplasm of pituitary gland: Secondary | ICD-10-CM | POA: Insufficient documentation

## 2015-09-19 DIAGNOSIS — K5901 Slow transit constipation: Secondary | ICD-10-CM

## 2015-09-19 HISTORY — DX: Benign neoplasm of pituitary gland: D35.2

## 2015-09-20 NOTE — Progress Notes (Signed)
Gastroenterology Consultation  Referring Provider:     No ref. provider found Primary Care Physician:  No PCP Per Patient Primary Gastroenterologist:  Dr. Allen Norris     Reason for Consultation:     Constipation        HPI:   Mary Estrada is a 50 y.o. y/o female referred for consultation & management of  constipation by Dr. Rayne Du PCP Per Patient.   This patient comes today with a history of constipation. The patient states that she was seen in the past by another gastroenterologist and had a colonoscopy that was normal. She now reports that she has tried multiple different medications including Linzess at 145 mg without much help. The patient also states that she tried Amitiza with did not work either. She reports that unless he takes multiple medications that once she only moves her bowels once a week. She denies any abdominal cramps, fevers, chills, black stools or bloody stools. She also denies any nausea or vomiting.  Past Medical History  Diagnosis Date  . Pituitary adenoma ([place]) 09/19/2015  . Biceps tendinitis 12/04/2014    Past Surgical History  Procedure Laterality Date  . Abdominal hysterectomy    . Cholecystectomy    . Tonsillectomy    . Myomectomy      Prior to Admission medications   Medication Sig Start Date End Date Taking? Authorizing Provider  aspirin EC 81 MG tablet Take 81 mg by mouth.   Yes Historical Provider, MD  calcium carbonate 1250 MG capsule Take 1,250 mg by mouth.   Yes Historical Provider, MD  cetirizine (ZYRTEC) 10 MG tablet Take 10 mg by mouth.   Yes Historical Provider, MD  cyanocobalamin (,VITAMIN B-12,) 1000 MCG/ML injection    Yes Historical Provider, MD  levothyroxine (LEVOTHROID) 137 MCG tablet Take by mouth.   Yes Historical Provider, MD  Multiple Vitamin (MULTI-VITAMINS) TABS Take by mouth.   Yes Historical Provider, MD  omeprazole (PRILOSEC) 40 MG capsule Take 40 mg by mouth.   Yes Historical Provider, MD  albuterol (PROVENTIL HFA;VENTOLIN HFA)  108 (90 BASE) MCG/ACT inhaler Inhale 2 puffs into the lungs every 6 (six) hours as needed for wheezing or shortness of breath. Patient not taking: Reported on 09/19/2015 06/17/15   Lavonia Drafts, MD  Cholecalciferol (D 1000) 1000 units capsule Take by mouth. Reported on 09/19/2015    Historical Provider, MD  conjugated estrogens (PREMARIN) vaginal cream Place vaginally. Reported on 09/19/2015 05/06/15   Historical Provider, MD  estradiol (ESTRACE VAGINAL) 0.1 MG/GM vaginal cream Place vaginally. Reported on 09/19/2015 05/09/15   Historical Provider, MD  gabapentin (NEURONTIN) 300 MG capsule Take 300 mg by mouth. Reported on 09/19/2015 01/26/15   Historical Provider, MD  HYDROcodone-acetaminophen (NORCO/VICODIN) 5-325 MG tablet Take by mouth. Reported on 09/19/2015 01/25/15   Historical Provider, MD  ibuprofen (ADVIL,MOTRIN) 800 MG tablet Take 800 mg by mouth. Reported on 09/19/2015 02/24/15   Historical Provider, MD  meclizine (ANTIVERT) 12.5 MG tablet Take 12.5 mg by mouth. Reported on 09/19/2015 05/18/14   Historical Provider, MD  Omega-3 1000 MG CAPS Take by mouth. Reported on 09/19/2015    Historical Provider, MD    Family History  Problem Relation Age of Onset  . Diabetes Mother   . Hypertension Mother      Social History  Substance Use Topics  . Smoking status: Never Smoker   . Smokeless tobacco: Never Used  . Alcohol Use: No    Allergies as of 09/19/2015 - Review Complete  09/19/2015  Allergen Reaction Noted  . Reglan [metoclopramide] Swelling 06/17/2015  . Prednisone Other (See Comments) 06/17/2015  . Grapefruit extract Itching 09/19/2015    Review of Systems:    All systems reviewed and negative except where noted in HPI.   Physical Exam:  BP 127/69 mmHg  Pulse 86  Temp(Src) 97.7 F (36.5 C) (Oral)  Ht 5\' 8"  (1.727 m)  Wt 194 lb (87.998 kg)  BMI 29.50 kg/m2 No LMP recorded. Patient has had a hysterectomy. Psych:  Alert and cooperative. Normal mood and affect. General:   Alert,   Well-developed, well-nourished, pleasant and cooperative in NAD Head:  Normocephalic and atraumatic. Eyes:  Sclera clear, no icterus.   Conjunctiva pink. Ears:  Normal auditory acuity. Nose:  No deformity, discharge, or lesions. Mouth:  No deformity or lesions,oropharynx pink & moist. Neck:  Supple; no masses or thyromegaly. Lungs:  Respirations even and unlabored.  Clear throughout to auscultation.   No wheezes, crackles, or rhonchi. No acute distress. Heart:  Regular rate and rhythm; no murmurs, clicks, rubs, or gallops. Abdomen:  Normal bowel sounds.  No bruits.  Soft, non-tender and non-distended without masses, hepatosplenomegaly or hernias noted.  No guarding or rebound tenderness.  Negative Carnett sign.   Rectal:  Deferred.  Msk:  Symmetrical without gross deformities.  Good, equal movement & strength bilaterally. Pulses:  Normal pulses noted. Extremities:  No clubbing or edema.  No cyanosis. Neurologic:  Alert and oriented x3;  grossly normal neurologically. Skin:  Intact without significant lesions or rashes.  No jaundice. Lymph Nodes:  No significant cervical adenopathy. Psych:  Alert and cooperative. Normal mood and affect.  Imaging Studies: No results found.  Assessment and Plan:   Mary Estrada is a 50 y.o. y/o female  who comes in today with a history of constipation. 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 The patient does not have any cramps therefore it is unlikely she has been low bowel syndrome. The patient has been told to try step up therapy with starting samples of Linzess at 290 mg. If this does not  Improve her symptoms she will then and Miralax or prune juice daily. She will continue to take everything once a day until she is feeling better and having more frequent bowel movements. The patient has been explained the plan and agrees with it.   Note: This dictation was prepared with Dragon dictation along with smaller phrase  technology. Any transcriptional errors that result from this process are unintentional.

## 2015-10-15 DIAGNOSIS — G5630 Lesion of radial nerve, unspecified upper limb: Secondary | ICD-10-CM | POA: Insufficient documentation

## 2015-10-15 DIAGNOSIS — G5601 Carpal tunnel syndrome, right upper limb: Secondary | ICD-10-CM | POA: Insufficient documentation

## 2015-10-28 ENCOUNTER — Telehealth: Payer: Self-pay | Admitting: Gastroenterology

## 2015-10-28 NOTE — Telephone Encounter (Signed)
In pain, no bowel movement in 4 days. Drinking prune juice and a lot of water. Please call

## 2015-10-28 NOTE — Telephone Encounter (Signed)
Please give the patient a prep and see if that cleans her our.

## 2015-10-28 NOTE — Telephone Encounter (Signed)
Spoke with pt regarding her constipation issues. She stated she done exactly what you told her with the step up method for relieving her constipation and nothing is working. I suggested adding Milk of Magnesia and she has already tried this. You gave her Linzess 290mg  to add if the step up method didn't work and she is out of samples, It didn't work. Please advise.

## 2015-10-30 NOTE — Telephone Encounter (Signed)
Left vm for pt to return my call.  

## 2015-10-31 NOTE — Telephone Encounter (Signed)
Pt coming today to pick up colon prep to try to see if she can get relief from constipation.

## 2015-11-07 ENCOUNTER — Ambulatory Visit
Admission: RE | Admit: 2015-11-07 | Discharge: 2015-11-07 | Disposition: A | Discharge status: Home / Self Care | Payer: BLUE CROSS/BLUE SHIELD | Source: Ambulatory Visit | Attending: Family Medicine | Admitting: Family Medicine

## 2015-11-07 ENCOUNTER — Other Ambulatory Visit: Payer: Self-pay | Admitting: Family Medicine

## 2015-11-07 DIAGNOSIS — M79661 Pain in right lower leg: Secondary | ICD-10-CM

## 2015-11-07 DIAGNOSIS — M79604 Pain in right leg: Secondary | ICD-10-CM | POA: Insufficient documentation

## 2015-11-07 DIAGNOSIS — M25561 Pain in right knee: Secondary | ICD-10-CM

## 2016-02-10 ENCOUNTER — Telehealth: Payer: Self-pay

## 2016-02-10 ENCOUNTER — Other Ambulatory Visit: Payer: Self-pay

## 2016-02-10 MED ORDER — NA SULFATE-K SULFATE-MG SULF 17.5-3.13-1.6 GM/177ML PO SOLN: 1.0000 | Freq: Once | ORAL | 0 refills | Status: AC

## 2016-02-10 NOTE — Telephone Encounter (Signed)
Gastroenterology Pre-Procedure Review  Request Date: 02/11/2016  Requesting Physician:   PATIENT REVIEW QUESTIONS: The patient responded to the following health history questions as indicated:    1. Are you having any GI issues? yes (irregular bowel movements, 1 every 4-5 days ) 2. Do you have a personal history of Polyps? no 3. Do you have a family history of Colon Cancer or Polyps? no 4. Diabetes Mellitus? no 5. Joint replacements in the past 12 months?no 6. Major health problems in the past 3 months?no 7. Any artificial heart valves, MVP, or defibrillator?no    MEDICATIONS & ALLERGIES:    Patient reports the following regarding taking any anticoagulation/antiplatelet therapy:   Plavix, Coumadin, Eliquis, Xarelto, Lovenox, Pradaxa, Brilinta, or Effient? no Aspirin? yes (heart health )  Patient confirms/reports the following medications:  Current Outpatient Prescriptions  Medication Sig Dispense Refill  . aspirin EC 81 MG tablet Take 81 mg by mouth.    . calcium carbonate 1250 MG capsule Take 1,250 mg by mouth.    . levothyroxine (LEVOTHROID) 137 MCG tablet Take by mouth.    . Multiple Vitamin (MULTI-VITAMINS) TABS Take by mouth.    . Omega-3 1000 MG CAPS Take by mouth. Reported on 09/19/2015    . omeprazole (PRILOSEC) 40 MG capsule Take 40 mg by mouth.    . cetirizine (ZYRTEC) 10 MG tablet Take 10 mg by mouth.    . Cholecalciferol (D 1000) 1000 units capsule Take by mouth. Reported on 09/19/2015    . cyanocobalamin (,VITAMIN B-12,) 1000 MCG/ML injection      No current facility-administered medications for this visit.     Patient confirms/reports the following allergies:  Allergies  Allergen Reactions  . Reglan [Metoclopramide] Swelling    angioedema  . Prednisone Other (See Comments)    Feels like she's being stuck with pins and needles  . Grapefruit Extract Itching    No orders of the defined types were placed in this encounter.   AUTHORIZATION INFORMATION Primary  Insurance: 1D#: Group #:  Secondary Insurance: 1D#: Group #:  SCHEDULE INFORMATION: Date: 02/11/2016 Time: Location: [facility]

## 2016-02-10 NOTE — Telephone Encounter (Signed)
Change in bowel habit R19.4 Dysphagia R13.10 [facility] [id number] Please pre-cert

## 2016-02-11 ENCOUNTER — Ambulatory Visit: Payer: BC Managed Care – PPO | Admitting: Anesthesiology

## 2016-02-11 ENCOUNTER — Encounter
Admission: RE | Disposition: A | Discharge status: Home / Self Care | Payer: Self-pay | Source: Ambulatory Visit | Attending: Gastroenterology

## 2016-02-11 ENCOUNTER — Ambulatory Visit
Admission: RE | Admit: 2016-02-11 | Discharge: 2016-02-11 | Disposition: A | Discharge status: Home / Self Care | Payer: BC Managed Care – PPO | Source: Ambulatory Visit | Attending: Gastroenterology | Admitting: Gastroenterology

## 2016-02-11 ENCOUNTER — Encounter: Payer: Self-pay | Admitting: *Deleted

## 2016-02-11 DIAGNOSIS — Z7982 Long term (current) use of aspirin: Secondary | ICD-10-CM | POA: Diagnosis not present

## 2016-02-11 DIAGNOSIS — K219 Gastro-esophageal reflux disease without esophagitis: Secondary | ICD-10-CM | POA: Diagnosis not present

## 2016-02-11 DIAGNOSIS — K5900 Constipation, unspecified: Secondary | ICD-10-CM

## 2016-02-11 DIAGNOSIS — R1310 Dysphagia, unspecified: Secondary | ICD-10-CM | POA: Insufficient documentation

## 2016-02-11 DIAGNOSIS — K2950 Unspecified chronic gastritis without bleeding: Secondary | ICD-10-CM | POA: Diagnosis not present

## 2016-02-11 DIAGNOSIS — K641 Second degree hemorrhoids: Secondary | ICD-10-CM | POA: Insufficient documentation

## 2016-02-11 DIAGNOSIS — K5909 Other constipation: Secondary | ICD-10-CM

## 2016-02-11 DIAGNOSIS — Z79899 Other long term (current) drug therapy: Secondary | ICD-10-CM | POA: Insufficient documentation

## 2016-02-11 DIAGNOSIS — K222 Esophageal obstruction: Secondary | ICD-10-CM | POA: Diagnosis not present

## 2016-02-11 HISTORY — PX: COLONOSCOPY WITH PROPOFOL: SHX5780

## 2016-02-11 HISTORY — PX: ESOPHAGOGASTRODUODENOSCOPY (EGD) WITH PROPOFOL: SHX5813

## 2016-02-11 SURGERY — COLONOSCOPY WITH PROPOFOL
Anesthesia: General

## 2016-02-11 MED ORDER — PROPOFOL 10 MG/ML IV BOLUS
INTRAVENOUS | Status: DC | PRN
  Administered 2016-02-11: 40 mg via INTRAVENOUS
  Administered 2016-02-11: 90 mg via INTRAVENOUS

## 2016-02-11 MED ORDER — SODIUM CHLORIDE 0.9 % IV SOLN
INTRAVENOUS | Status: DC
  Administered 2016-02-11: 1000 mL via INTRAVENOUS

## 2016-02-11 MED ORDER — GLYCOPYRROLATE 0.2 MG/ML IJ SOLN
INTRAMUSCULAR | Status: DC | PRN
  Administered 2016-02-11: 0.2 mg via INTRAVENOUS

## 2016-02-11 MED ORDER — PROPOFOL 500 MG/50ML IV EMUL
INTRAVENOUS | Status: DC | PRN
  Administered 2016-02-11: 125 ug/kg/min via INTRAVENOUS

## 2016-02-11 MED ORDER — LIDOCAINE HCL (CARDIAC) 20 MG/ML IV SOLN
INTRAVENOUS | Status: DC | PRN
  Administered 2016-02-11: 100 mg via INTRAVENOUS

## 2016-02-11 NOTE — H&P (Signed)
  Lucilla Lame, MD [address] [place], [place] [address] Phone: [phone] Fax : [phone]  Primary Care Physician:  Ellamae Sia, MD Primary Gastroenterologist:  Dr. Allen Norris  Pre-Procedure History & Physical: HPI:  Mary Estrada is a 50 y.o. female is here for an endoscopy and colonoscopy.   Past Medical History:  Diagnosis Date  . Biceps tendinitis 12/04/2014  . Pituitary adenoma ([place]) 09/19/2015    Past Surgical History:  Procedure Laterality Date  . ABDOMINAL HYSTERECTOMY    . CHOLECYSTECTOMY    . MYOMECTOMY    . TONSILLECTOMY      Prior to Admission medications   Medication Sig Start Date End Date Taking? Authorizing Provider  aspirin EC 81 MG tablet Take 81 mg by mouth.   Yes Historical Provider, MD  calcium carbonate 1250 MG capsule Take 1,250 mg by mouth.   Yes Historical Provider, MD  cetirizine (ZYRTEC) 10 MG tablet Take 10 mg by mouth.   Yes Historical Provider, MD  Cholecalciferol (D 1000) 1000 units capsule Take by mouth. Reported on 09/19/2015   Yes Historical Provider, MD  cyanocobalamin (,VITAMIN B-12,) 1000 MCG/ML injection    Yes Historical Provider, MD  levothyroxine (LEVOTHROID) 137 MCG tablet Take by mouth.   Yes Historical Provider, MD  Multiple Vitamin (MULTI-VITAMINS) TABS Take by mouth.   Yes Historical Provider, MD  Omega-3 1000 MG CAPS Take by mouth. Reported on 09/19/2015   Yes Historical Provider, MD  omeprazole (PRILOSEC) 40 MG capsule Take 40 mg by mouth.   Yes Historical Provider, MD    Allergies as of 02/10/2016 - Review Complete 02/10/2016  Allergen Reaction Noted  . Reglan [metoclopramide] Swelling 06/17/2015  . Prednisone Other (See Comments) 06/17/2015  . Grapefruit extract Itching 09/19/2015    Family History  Problem Relation Age of Onset  . Diabetes Mother   . Hypertension Mother     Social History   Social History  . Marital status: Divorced    Spouse name: N/A  . Number of children: N/A  . Years of  education: N/A   Occupational History  . Not on file.   Social History Main Topics  . Smoking status: Never Smoker  . Smokeless tobacco: Never Used  . Alcohol use No  . Drug use: No  . Sexual activity: Not on file   Other Topics Concern  . Not on file   Social History Narrative  . No narrative on file    Review of Systems: See HPI, otherwise negative ROS  Physical Exam: Pulse 73   Temp 97.6 F (36.4 C) (Tympanic)   Resp 16   Ht 5\' 8"  (1.727 m)   Wt 193 lb (87.5 kg)   BMI 29.35 kg/m  General:   Alert,  pleasant and cooperative in NAD Head:  Normocephalic and atraumatic. Neck:  Supple; no masses or thyromegaly. Lungs:  Clear throughout to auscultation.    Heart:  Regular rate and rhythm. Abdomen:  Soft, nontender and nondistended. Normal bowel sounds, without guarding, and without rebound.   Neurologic:  Alert and  oriented x4;  grossly normal neurologically.  Impression/Plan: Mary Estrada is here for an endoscopy and colonoscopy to be performed for dysphagia and constipation.   Risks, benefits, limitations, and alternatives regarding  endoscopy and colonoscopy have been reviewed with the patient.  Questions have been answered.  All parties agreeable.   Lucilla Lame, MD  02/11/2016, 10:07 AM

## 2016-02-11 NOTE — Op Note (Signed)
[facility] Gastroenterology Patient Name: Mary Estrada Procedure Date: 02/11/2016 10:23 AM MRN: [id number] Account #: [id number] Date of Birth: 1966/06/25 Admit Type: Outpatient Age: 50 Room: [facility] ENDO ROOM 4 Gender: Female Note Status: Finalized Procedure:            Colonoscopy Indications:          Constipation Providers:            Lucilla Lame MD, MD Referring MD:         No Local Md, MD (Referring MD) Medicines:            Propofol per Anesthesia Complications:        No immediate complications. Procedure:            Pre-Anesthesia Assessment:                       - Prior to the procedure, a History and Physical was                        performed, and patient medications and allergies were                        reviewed. The patient's tolerance of previous                        anesthesia was also reviewed. The risks and benefits of                        the procedure and the sedation options and risks were                        discussed with the patient. All questions were                        answered, and informed consent was obtained. Prior                        Anticoagulants: The patient has taken no previous                        anticoagulant or antiplatelet agents. ASA Grade                        Assessment: II - A patient with mild systemic disease.                        After reviewing the risks and benefits, the patient was                        deemed in satisfactory condition to undergo the                        procedure.                       After obtaining informed consent, the colonoscope was                        passed under direct vision. Throughout the procedure,  the patient's blood pressure, pulse, and oxygen                        saturations were monitored continuously. The                        Colonoscope was introduced through the anus and                        advanced to the the  cecum, identified by appendiceal                        orifice and ileocecal valve. The colonoscopy was                        performed without difficulty. The patient tolerated the                        procedure well. The quality of the bowel preparation                        was excellent. Findings:      The perianal and digital rectal examinations were normal.      Non-bleeding internal hemorrhoids were found during retroflexion. The       hemorrhoids were Grade II (internal hemorrhoids that prolapse but reduce       spontaneously). Impression:           - Non-bleeding internal hemorrhoids.                       - No specimens collected. Recommendation:       - Repeat colonoscopy in 10 years for screening unless                        any change in family history or lower GI problems. Procedure Code(s):    --- Professional ---                       [phone], Colonoscopy, flexible; diagnostic, including                        collection of specimen(s) by brushing or washing, when                        performed (separate procedure) Diagnosis Code(s):    --- Professional ---                       K59.00, Constipation, unspecified CPT copyright 2016 American Medical Association. All rights reserved. The codes documented in this report are preliminary and upon coder review may  be revised to meet current compliance requirements. Lucilla Lame MD, MD 02/11/2016 11:01:34 AM This report has been signed electronically. Number of Addenda: 0 Note Initiated On: 02/11/2016 10:23 AM Scope Withdrawal Time: 0 hours 5 minutes 52 seconds  Total Procedure Duration: 0 hours 9 minutes 52 seconds       [facility]

## 2016-02-11 NOTE — Anesthesia Postprocedure Evaluation (Signed)
Anesthesia Post Note  Patient: CATLYNN KIRKSEY  Procedure(s) Performed: Procedure(s) (LRB): COLONOSCOPY WITH PROPOFOL (N/A) ESOPHAGOGASTRODUODENOSCOPY (EGD) WITH PROPOFOL (N/A)  Patient location during evaluation: Endoscopy Anesthesia Type: General Level of consciousness: awake and alert Pain management: pain level controlled Vital Signs Assessment: post-procedure vital signs reviewed and stable Respiratory status: spontaneous breathing, nonlabored ventilation, respiratory function stable and patient connected to nasal cannula oxygen Cardiovascular status: blood pressure returned to baseline and stable Postop Assessment: no signs of nausea or vomiting Anesthetic complications: no    Last Vitals:  Vitals:   02/11/16 1117 02/11/16 1127  BP: 99/70 122/80  Pulse: 66 (!) 42  Resp: 17 19  Temp:      Last Pain:  Vitals:   02/11/16 1000  TempSrc: Tympanic                 Martha Clan

## 2016-02-11 NOTE — Op Note (Addendum)
[facility] Gastroenterology Patient Name: Mary Estrada Procedure Date: 02/11/2016 10:25 AM MRN: [id number] Account #: [id number] Date of Birth: 03-Feb-1966 Admit Type: Outpatient Age: 50 Room: [facility] ENDO ROOM 4 Gender: Female Note Status: Supervisor Override Procedure:            Upper GI endoscopy Indications:          Dysphagia Providers:            Lucilla Lame MD, MD Referring MD:         No Local Md, MD (Referring MD) Medicines:            Propofol per Anesthesia Complications:        No immediate complications. Procedure:            Pre-Anesthesia Assessment:                       - Prior to the procedure, a History and Physical was                        performed, and patient medications and allergies were                        reviewed. The patient's tolerance of previous                        anesthesia was also reviewed. The risks and benefits of                        the procedure and the sedation options and risks were                        discussed with the patient. All questions were                        answered, and informed consent was obtained. Prior                        Anticoagulants: The patient has taken no previous                        anticoagulant or antiplatelet agents. ASA Grade                        Assessment: II - A patient with mild systemic disease.                        After reviewing the risks and benefits, the patient was                        deemed in satisfactory condition to undergo the                        procedure.                       After obtaining informed consent, the endoscope was                        passed under direct vision. Throughout the procedure,  the patient's blood pressure, pulse, and oxygen                        saturations were monitored continuously. The Endoscope                        was introduced through the mouth, and advanced to the       second part of duodenum. The upper GI endoscopy was                        accomplished without difficulty. The patient tolerated                        the procedure well. Findings:      Localized mild inflammation characterized by erythema was found in the       gastric body. Biopsies were taken with a cold forceps for histology.      The examined duodenum was normal.      One mild benign-appearing, intrinsic stenosis was found at the       gastroesophageal junction. And was traversed. A TTS dilator was passed       through the scope. Dilation with a 15-16.5-18 mm balloon dilator was       performed to 18 mm. The dilation site was examined following endoscope       reinsertion and showed complete resolution of luminal narrowing. Two       biopsies were obtained in the middle third of the esophagus with cold       forceps for histology. Impression:           - Gastritis. Biopsied.                       - Normal examined duodenum.                       - Benign-appearing esophageal stenosis. Dilated.                       - Two biopsies were obtained in the middle third of the                        esophagus. Recommendation:       - Await pathology results. Procedure Code(s):    --- Professional ---                       [phone], Esophagogastroduodenoscopy, flexible, transoral;                        with transendoscopic balloon dilation of esophagus                        (less than 30 mm diameter)                       [id number], Esophagogastroduodenoscopy, flexible, transoral;                        with biopsy, single or multiple Diagnosis Code(s):    --- Professional ---                       R13.10, Dysphagia, unspecified  K22.2, Esophageal obstruction CPT copyright 2016 American Medical Association. All rights reserved. The codes documented in this report are preliminary and upon coder review may  be revised to meet current compliance requirements. Lucilla Lame MD, MD 02/11/2016 10:48:11 AM This report has been signed electronically. Number of Addenda: 0 Note Initiated On: 02/11/2016 10:25 AM      [facility]

## 2016-02-11 NOTE — Transfer of Care (Signed)
Immediate Anesthesia Transfer of Care Note  Patient: Mary Estrada  Procedure(s) Performed: Procedure(s): COLONOSCOPY WITH PROPOFOL (N/A) ESOPHAGOGASTRODUODENOSCOPY (EGD) WITH PROPOFOL (N/A)  Patient Location: Endoscopy Unit  Anesthesia Type:General  Level of Consciousness: sedated  Airway & Oxygen Therapy: Patient Spontanous Breathing and Patient connected to nasal cannula oxygen  Post-op Assessment: Report given to RN and Post -op Vital signs reviewed and stable  Post vital signs: Reviewed and stable  Last Vitals:  Vitals:   02/11/16 1000  Pulse: 73  Resp: 16  Temp: 36.4 C    Last Pain:  Vitals:   02/11/16 1000  TempSrc: Tympanic         Complications: No apparent anesthesia complications

## 2016-02-11 NOTE — Anesthesia Preprocedure Evaluation (Signed)
Anesthesia Evaluation  Patient identified by MRN, date of birth, ID band  Reviewed: Allergy & Precautions, H&P , NPO status , Patient's Chart, lab work & pertinent test results, reviewed documented beta blocker date and time   History of Anesthesia Complications Negative for: history of anesthetic complications  Airway Mallampati: III  TM Distance: >3 FB Neck ROM: full    Dental   Braces:   Pulmonary neg pulmonary ROS,    Pulmonary exam normal breath sounds clear to auscultation       Cardiovascular Exercise Tolerance: Good negative cardio ROS Normal cardiovascular exam Rhythm:regular Rate:Normal     Neuro/Psych  Headaches, neg Seizures Pituitary adenoma, resolved TIAnegative psych ROS   GI/Hepatic Neg liver ROS, GERD  Medicated,  Endo/Other  neg diabetesHypothyroidism 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  Renal/GU negative Renal ROS  negative genitourinary   Musculoskeletal   Abdominal   Peds  Hematology negative hematology ROS (+)   Anesthesia Other Findings Past Medical History: 12/04/2014: Biceps tendinitis 09/19/2015: Pituitary adenoma ([place])   Reproductive/Obstetrics negative OB ROS                             Anesthesia Physical Anesthesia Plan  ASA: II  Anesthesia Plan: General   Post-op Pain Management:    Induction:   Airway Management Planned:   Additional Equipment:   Intra-op Plan:   Post-operative Plan:   Informed Consent: I have reviewed the patients History and Physical, chart, labs and discussed the procedure including the risks, benefits and alternatives for the proposed anesthesia with the patient or authorized representative who has indicated his/her understanding and acceptance.   Dental Advisory Given  Plan Discussed with: Anesthesiologist, CRNA and Surgeon  Anesthesia Plan Comments:         Anesthesia Quick Evaluation

## 2016-02-12 ENCOUNTER — Encounter: Payer: Self-pay | Admitting: Gastroenterology

## 2016-02-12 LAB — SURGICAL PATHOLOGY

## 2016-02-17 ENCOUNTER — Encounter: Payer: Self-pay | Admitting: Gastroenterology

## 2016-02-24 ENCOUNTER — Telehealth: Payer: Self-pay | Admitting: Gastroenterology

## 2016-02-24 NOTE — Telephone Encounter (Signed)
Spoke with pt and recommended her to start taking Miralax daily as well as a probiotic to her daily regimen. Pt also stated she feels like she still is having issues swallowing. Advised her sometimes we will need to do a repeat EGD as her esophagus may not stay dilated. Pt will do these recommendations and contact me if no improvement in her symptoms.

## 2016-02-24 NOTE — Telephone Encounter (Signed)
After colonoscopy and EGD patient is still having symptoms, cant go to the bathroom and chocking. Please call

## 2016-04-29 ENCOUNTER — Other Ambulatory Visit: Payer: Self-pay | Admitting: Otolaryngology

## 2016-04-29 DIAGNOSIS — R490 Dysphonia: Secondary | ICD-10-CM

## 2016-05-15 ENCOUNTER — Encounter: Payer: Self-pay | Admitting: Speech Pathology

## 2016-05-15 ENCOUNTER — Ambulatory Visit
Admission: RE | Admit: 2016-05-15 | Discharge: 2016-05-15 | Disposition: A | Discharge status: Home / Self Care | Payer: BC Managed Care – PPO | Source: Ambulatory Visit | Attending: Otolaryngology | Admitting: Otolaryngology

## 2016-05-15 ENCOUNTER — Ambulatory Visit: Payer: BC Managed Care – PPO | Attending: Otolaryngology | Admitting: Speech Pathology

## 2016-05-15 DIAGNOSIS — R490 Dysphonia: Secondary | ICD-10-CM

## 2016-05-15 DIAGNOSIS — R1312 Dysphagia, oropharyngeal phase: Secondary | ICD-10-CM

## 2016-05-15 NOTE — Therapy (Signed)
[place] MAIN [facility] SERVICES [address] [place], [place], [address] Phone: [phone]   Fax:  [phone]  Speech Language Pathology Evaluation  Patient Details  Name: Mary Estrada MRN: [id number] Date of Birth: Nov 03, 1965 Referring Provider: Dr. Pryor Ochoa  Encounter Date: 05/15/2016      End of Session - 05/15/16 1502    Visit Number 1   Number of Visits 9   Date for SLP Re-Evaluation 07/10/16   SLP Start Time 26   SLP Stop Time  1200   SLP Time Calculation (min) 60 min   Activity Tolerance Patient tolerated treatment well      Past Medical History:  Diagnosis Date   Biceps tendinitis 12/04/2014   Pituitary adenoma ([place]) 09/19/2015    Past Surgical History:  Procedure Laterality Date   ABDOMINAL HYSTERECTOMY     CHOLECYSTECTOMY     COLONOSCOPY WITH PROPOFOL N/A 02/11/2016   Procedure: COLONOSCOPY WITH PROPOFOL;  Surgeon: Lucilla Lame, MD;  Location: [facility] ENDOSCOPY;  Service: Endoscopy;  Laterality: N/A;   ESOPHAGOGASTRODUODENOSCOPY (EGD) WITH PROPOFOL N/A 02/11/2016   Procedure: ESOPHAGOGASTRODUODENOSCOPY (EGD) WITH PROPOFOL;  Surgeon: Lucilla Lame, MD;  Location: [facility] ENDOSCOPY;  Service: Endoscopy;  Laterality: N/A;   MYOMECTOMY     TONSILLECTOMY      There were no vitals filed for this visit.      Subjective Assessment - 05/15/16 1501    Currently in Pain? No/denies            SLP Evaluation [facility] - 05/15/16 0001      SLP Visit Information   SLP Received On 05/15/16   Referring Provider Dr. Pryor Ochoa   Onset Date referral 04/28/16   Medical Diagnosis muscle tension dysphonia     Subjective   Subjective The patient reports consistent hoarse, raspy voice with unusually high pitch following procedure 3 months ago.   Patient/Family Stated Goal Regain normal voice and eliminate vocal fatigue.     General Information   HPI The patient was referred by Dr. Pryor Ochoa for treatment of muscle tension dysphonia. ENT  report identified bilateral muscle tension dysphonia with touching of false vocal folds with phonation, touching of arytenoids prior to vocal folds; true vocal folds were mobile. The patient reports consistent hoarseness, raspy quality, and unusually high-pitched voice following esophagogastroduodenoscopy and colonoscopy on February 11, 2016. The patient describes symptoms as gradually worsening and denies pain when phonating. She reported pressure lateral to hyoid bone and thyroid cartilage as painful. She also described globus sensation upon swallowing food and drink, although she identifies this problem as being of long duration (since long prior to EGD this year). She reports a long history of GERD and current use of omeprazole.      Oral Motor/Sensory Function   Overall Oral Motor/Sensory Function Appears within functional limits for tasks assessed     Motor Speech   Overall Motor Speech Appears within functional limits for tasks assessed     Standardized Assessments   Standardized Assessments  Other Assessment  Perceptual Voice Evaluation      Perceptual Voice Evaluation  Voice history: The patient was referred by Dr. Pryor Ochoa for treatment of muscle tension dysphonia. ENT report identified bilateral muscle tension dysphonia with touching of false vocal folds with phonation, touching of arytenoids prior to vocal folds; true vocal folds were mobile.   The patient reports consistent hoarseness, raspy quality, and unusually high-pitched voice following esophagogastroduodenoscopy and colonoscopy on February 11, 2016. The patient describes symptoms as gradually  worsening and denies pain when phonating. She reported pressure lateral to hyoid bone and thyroid cartilage as painful. She also described globus sensation upon swallowing food and drink, although she identifies this problem as being of long duration (since long prior to EGD this year). She reports a long history of GERD and current use of  omeprazole.  Vocal checklist: The patient reports constantly talking at work as well as conversing with mother and children at home. No other environmental voice risks were reported. The patient reports good hydration habits and very low consumption of alcohol and caffeine.   Patient Quality of Life Survey: Voice Handicap Index-10 Score of 18  A score of 10 or higher indicates perceived handicap  Maximum phonation time for sustained ah: 19 Mean intensity during sustained ah: 63 dB Vocal quality during sustained ah: hoarse, breathy, mildly strained  Mean intensity sustained during reading aloud: 61 dB Vocal quality during reading aloud: hoarse, raspy  Average time patient was able to sustain /s/: 12 seconds Average time patient was able to sustain /z/: 12 seconds   Note: vocal quality during /z/: low intensity, raspy, moments of aphonia, inconsistent phonation s/z ratio : 1  Pitch range during ascending and descending pitch glides perceived to be mildly restricted-within normal limits   Visi-Pitch: Multi-Dimensional Voice Program (MDVP)  MDVP extracts objective quantitative values (Relative Average Perturbation, Shimmer, Voice Turbulence Index, and Noise to Harmonic Ratio) on sustained phonation, which are displayed graphically and numerically in comparison to a built-in normative database.  The patient exhibited values outside the norm for Relative Average Perturbation, Shimmer, Voice Turbulence Index, and Noise to Harmonic Ratio.  Average fundamental frequency was 127 Hz -4.2 STD below the average for gender. The patient improved fundamental frequency (to WNL), Relative Average Perturbation, Shimmer, and Noise to Harmonic Ratio when cued to alter voicing (loud like me). Shimmer Percent, Noise to Harmonic Ratio, and Voice Turbulence Index were further improved during ascending pitch glide following clinician model - Noise to Harmonic Ratio and Voice Turbulence Index to within normal  limits.    Stimulability: The patient was provided with education and demonstration of breath support exercises and oral phonation exercises and quickly succeeded in performing these tasks. Her phonatory quality improved over the course of the evaluation, particularly in imitation, humming, sustained vowel phonation, and one-word productions.                    SLP Education - 05/15/16 1501    Education provided Yes   Education Details role of SLP in voice therapy; impact of tension, breath support, and oral resonance on vocal quality   Person(s) Educated Patient   Methods Explanation   Comprehension Verbalized understanding            SLP Long Term Goals - 05/15/16 1610      SLP LONG TERM GOAL #1   Title The patient will demonstrate independent understanding of vocal hygiene concepts and neck, shoulder, lingual stretching exercises.   Time 8   Period Weeks   Status New     SLP LONG TERM GOAL #2   Title The patient will be independent for abdominal breathing and breath support exercises.   Time 8   Period Weeks   Status New     SLP LONG TERM GOAL #3   Title The patient will minimize vocal tension via Yawn-Sigh approach (or comparable technique) with min SLP cues with 80% accuracy.   Time 8   Period Weeks  Status New     SLP LONG TERM GOAL #4   Title The patient will maintain relaxed phonation / oral resonance for paragraph length recitation with 80% accuracy.   Time 8   Period Weeks   Status New     SLP LONG TERM GOAL #5   Title The patient will maximize voice quality and loudness using breath support for paragraph length recitation with 80% accuracy.   Time 8   Period Weeks   Status New          Plan - 05/15/16 1506    Clinical Impression Statement This 50 year old woman under the care of Dr. Pryor Ochoa demonstrates moderate-severe dysphonia characterized by hoarse, raspy, and high-pitched voicing, laryngeal and extralaryngeal tension, and vocal  fatigue.  She will benefit from voice therapy for education, to improve breath support, reduce tension, promote easy flow phonation, and improve forward/oral resonance. The patient mentioned in passing that she had received speech therapy in the past after stroke, but we were not able to find details of this event in available records. The patient also expressed that it may be challenging for her to attend weekly tx sessions because of work situation in [place].   Speech Therapy Frequency 1x /week   Duration Other (comment)  8 weeks   Treatment/Interventions Cueing hierarchy;SLP instruction and feedback;Functional tasks;Patient/family education;Other (comment)  voice therapy   Potential to Achieve Goals Good   Potential Considerations Ability to learn/carryover information;Family/community support;Cooperation/participation level;Severity of impairments   SLP Home Exercise Plan introductory breath support and oral resonance exercises provided   Consulted and Agree with Plan of Care Patient      Patient will benefit from skilled therapeutic intervention in order to improve the following deficits and impairments:   Dysphonia    Problem List Patient Active Problem List   Diagnosis Date Noted   Chronic constipation    Problems with swallowing and mastication    Stricture and stenosis of esophagus    Pituitary adenoma ([place]) 09/19/2015   Biceps tendinitis 12/04/2014   Impingement syndrome of right shoulder 12/04/2014   Impingement syndrome of shoulder 10/03/2014   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 [place], [place], [address] Phone:  [phone]   Fax:  [phone]  Name: Mary Estrada MRN: [id number] Date of Birth: [birth year]

## 2016-05-15 NOTE — Therapy (Signed)
[place], [place], [address] Phone: [phone]   Fax:     Modified Barium Swallow  Patient Details  Name: Mary Estrada MRN: [id number] Date of Birth: 1966/07/12 No Data Recorded  Encounter Date: 05/15/2016      End of Session - 05/15/16 1347    Visit Number 1   Number of Visits 1   Date for SLP Re-Evaluation 05/15/16   SLP Start Time 1227   SLP Stop Time  1314   SLP Time Calculation (min) 47 min   Activity Tolerance Patient tolerated treatment well      Past Medical History:  Diagnosis Date  . Biceps tendinitis 12/04/2014  . Pituitary adenoma ([place]) 09/19/2015    Past Surgical History:  Procedure Laterality Date  . ABDOMINAL HYSTERECTOMY    . CHOLECYSTECTOMY    . COLONOSCOPY WITH PROPOFOL N/A 02/11/2016   Procedure: COLONOSCOPY WITH PROPOFOL;  Surgeon: Lucilla Lame, MD;  Location: [facility] ENDOSCOPY;  Service: Endoscopy;  Laterality: N/A;  . ESOPHAGOGASTRODUODENOSCOPY (EGD) WITH PROPOFOL N/A 02/11/2016   Procedure: ESOPHAGOGASTRODUODENOSCOPY (EGD) WITH PROPOFOL;  Surgeon: Lucilla Lame, MD;  Location: [facility] ENDOSCOPY;  Service: Endoscopy;  Laterality: N/A;  . MYOMECTOMY    . TONSILLECTOMY      There were no vitals filed for this visit.   Subjective: Patient behavior: (alertness, ability to follow instructions, etc.): the patient is able to report her medical/swallowing history and follow directions  Chief complaint: globus   Objective:  Radiological Procedure: A videoflouroscopic evaluation of oral-preparatory, reflex initiation, and pharyngeal phases of the swallow was performed; as well as a screening of the upper esophageal phase.  I. POSTURE: Upright in MBS chair and standing  II. VIEW: Lateral and A-P  III. COMPENSATORY STRATEGIES: N/A  IV. BOLUSES ADMINISTERED:   Thin Liquid: 2 small sips, 5 rapid, consecutive sips   Nectar-thick Liquid: 2 moderate size boluses    Puree: 3  teaspoon presentations   Mechanical Soft: 1/4 graham cracker in applesauce  V. RESULTS OF EVALUATION: A. ORAL PREPARATORY PHASE: (The lips, tongue, and velum are observed for strength and coordination)       **Overall Severity Rating: Mild; delayed/prolonged oral transit;   back-and-forth movement of all boluses and pre-swallow spill to the valleculae of a small portion of each bolus.    B. SWALLOW INITIATION/REFLEX: (The reflex is normal if "triggered" by the time the bolus reached the base of the tongue)  **Overall Severity Rating: Minimal; triggers at the valleculae  C. PHARYNGEAL PHASE: (Pharyngeal function is normal if the bolus shows rapid, smooth, and continuous transit through the pharynx and there is no pharyngeal residue after the swallow)  **Overall Severity Rating: Mild; decreased laryngeal elevation (with good anterior hyoid movement), and decreased tongue base retraction.  There is minimal vallecular residue across consistencies (in A-P view the residue is greater on the right).  This clears with dry swallows.  D. LARYNGEAL PENETRATION: (Material entering into the laryngeal inlet/vestibule but not aspirated) None  E. ASPIRATION: None  F. ESOPHAGEAL PHASE: (Screening of the upper esophagus) An esophageal sweep in the upright position with liquid and solid  consistencies showed mid-esophageal retention with retrograde flow within the  esophagus, but not through the PES.  A small amount of liquid was retained in the upper esophagus (below the PES) at approximately the point at which she senses food getting hung.  ASSESSMENT: 50 year old woman, with Muscle Tension Dysphonia and the sensation of food getting  hung in her throat, is presenting with mild oropharyngeal dysphagia characterized by delayed/prolonged oral transit, delayed pharyngeal swallow initiation, decreased laryngeal elevation (with good anterior hyoid movement), and decreased tongue base retraction.  There is minimal  vallecular residue across consistencies (in A-P view the residue is greater on the right).  This clears with dry swallows.  There was no observed laryngeal penetration/aspiration of across textures, although the patient periodically cleared her throat after swallowing.  The etiology of the delayed/prolonged oral transit is not clear.  Behavioral observations include back-and-forth movement of all boluses and pre-swallow spill to the valleculae of a small portion of each bolus.  The patient is not at significant risk for prandial aspiration.  An esophageal sweep in the upright position with liquid and solid  consistencies showed mid-esophageal retention with retrograde flow within the  esophagus, but not through the PES.  A small amount of liquid was retained in the upper esophagus (below the PES) at approximately the point at which she senses food getting hung.  PLAN/RECOMMENDATIONS:   A. Diet: Regular   B. Swallowing Precautions: No new recommendations   C. Recommended consultation to: follow up with Dr. Pryor Ochoa as recommended   D. Therapy recommendations: patient to begin voice therapy next week   E. Results and recommendations were with the patient immediately following the study and the final report routed to Dr. Pryor Ochoa.     Dysphagia, oropharyngeal phase  Hoarseness - Plan: DG SWALLOWING FUNC-SPEECH PATHOLOGY, DG SWALLOWING FUNC-SPEECH PATHOLOGY  Dysphonia - Plan: DG SWALLOWING FUNC-SPEECH PATHOLOGY, DG SWALLOWING FUNC-SPEECH PATHOLOGY        Problem List Patient Active Problem List   Diagnosis Date Noted  . Chronic constipation   . Problems with swallowing and mastication   . Stricture and stenosis of esophagus   . Pituitary adenoma ([place]) 09/19/2015  . Biceps tendinitis 12/04/2014  . Impingement syndrome of right shoulder 12/04/2014  . Impingement syndrome of shoulder 10/03/2014  . Asian flu 07/14/2013  . Abnormal CAT scan 07/16/2011  . Unspecified dyspareunia 07/03/2011   . Microscopic hematuria 07/03/2011  . 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 Frequent UTI 07/03/2011  . 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

## 2016-05-20 ENCOUNTER — Ambulatory Visit: Payer: BC Managed Care – PPO | Admitting: Speech Pathology

## 2016-05-27 ENCOUNTER — Ambulatory Visit: Payer: BC Managed Care – PPO | Admitting: Speech Pathology

## 2016-05-28 ENCOUNTER — Ambulatory Visit: Payer: BC Managed Care – PPO | Admitting: Speech Pathology

## 2016-06-01 ENCOUNTER — Ambulatory Visit: Payer: BC Managed Care – PPO | Admitting: Speech Pathology

## 2016-06-02 ENCOUNTER — Ambulatory Visit: Payer: BC Managed Care – PPO | Admitting: Speech Pathology

## 2016-06-08 ENCOUNTER — Ambulatory Visit: Payer: BC Managed Care – PPO | Admitting: Speech Pathology

## 2016-06-09 ENCOUNTER — Ambulatory Visit: Payer: BC Managed Care – PPO | Admitting: Speech Pathology

## 2016-06-10 ENCOUNTER — Ambulatory Visit: Payer: BC Managed Care – PPO | Admitting: Speech Pathology

## 2016-06-16 ENCOUNTER — Ambulatory Visit: Payer: BC Managed Care – PPO | Admitting: Speech Pathology

## 2016-06-17 ENCOUNTER — Ambulatory Visit: Payer: BC Managed Care – PPO | Admitting: Speech Pathology

## 2016-06-18 ENCOUNTER — Ambulatory Visit: Payer: BC Managed Care – PPO | Admitting: Speech Pathology

## 2016-06-23 ENCOUNTER — Ambulatory Visit: Payer: BC Managed Care – PPO | Admitting: Speech Pathology

## 2016-06-30 ENCOUNTER — Ambulatory Visit: Payer: BC Managed Care – PPO | Admitting: Speech Pathology

## 2016-10-29 DIAGNOSIS — E079 Disorder of thyroid, unspecified: Secondary | ICD-10-CM | POA: Insufficient documentation

## 2016-11-01 DIAGNOSIS — N993 Prolapse of vaginal vault after hysterectomy: Secondary | ICD-10-CM | POA: Insufficient documentation

## 2016-11-01 DIAGNOSIS — N3011 Interstitial cystitis (chronic) with hematuria: Secondary | ICD-10-CM | POA: Insufficient documentation

## 2016-11-01 DIAGNOSIS — R7303 Prediabetes: Secondary | ICD-10-CM | POA: Insufficient documentation

## 2019-01-20 ENCOUNTER — Encounter: Payer: Self-pay | Admitting: Podiatry

## 2019-01-20 ENCOUNTER — Ambulatory Visit: Payer: BC Managed Care – PPO | Admitting: Podiatry

## 2019-01-20 ENCOUNTER — Ambulatory Visit (INDEPENDENT_AMBULATORY_CARE_PROVIDER_SITE_OTHER): Payer: BC Managed Care – PPO

## 2019-01-20 ENCOUNTER — Other Ambulatory Visit: Payer: Self-pay

## 2019-01-20 VITALS — BP 109/66 | HR 71 | Temp 97.6°F

## 2019-01-20 DIAGNOSIS — M21619 Bunion of unspecified foot: Secondary | ICD-10-CM

## 2019-01-20 DIAGNOSIS — M21612 Bunion of left foot: Secondary | ICD-10-CM

## 2019-01-20 DIAGNOSIS — B351 Tinea unguium: Secondary | ICD-10-CM | POA: Diagnosis not present

## 2019-01-20 DIAGNOSIS — M79675 Pain in left toe(s): Secondary | ICD-10-CM | POA: Diagnosis not present

## 2019-01-20 MED ORDER — TERBINAFINE HCL 250 MG PO TABS: 250.0000 mg | ORAL_TABLET | Freq: Every day | ORAL | 0 refills | Status: AC

## 2019-01-20 NOTE — Patient Instructions (Addendum)
For the toenail fungus: recommend Urea 40% topical gel from [facility] to soften the nail as it grows out.    Pre-Operative Instructions  Congratulations, you have decided to take an important step towards improving your quality of life.  You can be assured that the doctors and staff at [facility] will be with you every step of the way.  Here are some important things you should know:  1. Plan to be at the surgery center/hospital at least 1 (one) hour prior to your scheduled time, unless otherwise directed by the surgical center/hospital staff.  You must have a responsible adult accompany you, remain during the surgery and drive you home.  Make sure you have directions to the surgical center/hospital to ensure you arrive on time. 2. If you are having surgery at [facility] or [facility], you will need a copy of your medical history and physical form from your family physician within one month prior to the date of surgery. We will give you a form for your primary physician to complete.  3. We make every effort to accommodate the date you request for surgery.  However, there are times where surgery dates or times have to be moved.  We will contact you as soon as possible if a change in schedule is required.   4. No aspirin/ibuprofen for one week before surgery.  If you are on aspirin, any non-steroidal anti-inflammatory medications (Mobic, Aleve, Ibuprofen) should not be taken seven (7) days prior to your surgery.  You make take Tylenol for pain prior to surgery.  5. Medications - If you are taking daily heart and blood pressure medications, seizure, reflux, allergy, asthma, anxiety, pain or diabetes medications, make sure you notify the surgery center/hospital before the day of surgery so they can tell you which medications you should take or avoid the day of surgery. 6. No food or drink after midnight the night before surgery unless directed otherwise by surgical center/hospital  staff. 7. No alcoholic beverages [id number] prior to surgery.  No smoking 24-hours prior or 24-hours after surgery. 8. Wear loose pants or shorts. They should be loose enough to fit over bandages, boots, and casts. 9. Don't wear slip-on shoes. Sneakers are preferred. 10. Bring your boot with you to the surgery center/hospital.  Also bring crutches or a walker if your physician has prescribed it for you.  If you do not have this equipment, it will be provided for you after surgery. 11. If you have not been contacted by the surgery center/hospital by the day before your surgery, call to confirm the date and time of your surgery. 12. Leave-time from work may vary depending on the type of surgery you have.  Appropriate arrangements should be made prior to surgery with your employer. 13. Prescriptions will be provided immediately following surgery by your doctor.  Fill these as soon as possible after surgery and take the medication as directed. Pain medications will not be refilled on weekends and must be approved by the doctor. 14. Remove nail polish on the operative foot and avoid getting pedicures prior to surgery. 15. Wash the night before surgery.  The night before surgery wash the foot and leg well with water and the antibacterial soap provided. Be sure to pay special attention to beneath the toenails and in between the toes.  Wash for at least three (3) minutes. Rinse thoroughly with water and dry well with a towel.  Perform this wash unless told not to do so  by your physician.  Enclosed: 1 Ice pack (please put in freezer the night before surgery)   1 Hibiclens skin cleaner   Pre-op instructions  If you have any questions regarding the instructions, please do not hesitate to call our office.  [place]: [address] [place], [place] [address] -- [place]: [address] [place], [place] [address] -- [phone]  [place]: [place], [place] [address] --  [phone]  [place]: [address] [place], [place], [place] [address] -- [phone]  Website: [email or web address]

## 2019-01-23 NOTE — Progress Notes (Signed)
   Subjective: 53 year old female presenting today as a new patient with a chief complaint of possible fungus of the left great toenail that has been symptomatic for the past few years. She also reports an intermittently painful bunion of the left foot that has also been present for the past few years. She has tried cutting the nail and applying topical medications with no significant relief. Wearing shoes increases the bunion pain. Patient is here for further evaluation and treatment.   Past Medical History:  Diagnosis Date  . Biceps tendinitis 12/04/2014  . Pituitary adenoma ([place]) 09/19/2015      Objective: Physical Exam General: The patient is alert and oriented x3 in no acute distress.  Dermatology: Hyperkeratotic, discolored, thickened, onychodystrophy of the left great toenail. Skin is cool, dry and supple bilateral lower extremities. Negative for open lesions or macerations.  Vascular: Palpable pedal pulses bilaterally. No edema or erythema noted. Capillary refill within normal limits.  Neurological: Epicritic and protective threshold grossly intact bilaterally.   Musculoskeletal Exam: Clinical evidence of bunion deformity noted to the respective foot. There is moderate pain on palpation range of motion of the first MPJ. Lateral deviation of the hallux noted consistent with hallux abductovalgus.  Radiographic Exam: Increased intermetatarsal angle greater than 15 with a hallux abductus angle greater than 30 noted on AP view. Moderate degenerative changes noted within the first MPJ.  Assessment: 1. 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 Onychomycosis left great toenail    Plan of Care:  1. Patient was evaluated. X-Rays reviewed. 2. Today we discussed the conservative versus surgical management of the presenting pathology. The patient opts for surgical management. All possible complications and details of the procedure were explained. All patient questions were  answered. No guarantees were expressed or implied. 3. Authorization for surgery was initiated today. Surgery will consist of bunionectomy with osteotomy left.  4. Prescription for Lamisil 250 mg #90 provided to patient.  5. CAM boot dispensed.  6. Return to clinic one week post op.   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 Wants to take one week off work only.     Edrick Kins, DPM [facility]  Dr. Edrick Kins, [place]                                        [place], [place] [address]                Office [phone]  Fax [phone]

## 2019-01-30 ENCOUNTER — Telehealth: Payer: Self-pay | Admitting: *Deleted

## 2019-01-30 NOTE — Telephone Encounter (Signed)
"  I need to schedule my surgery."    I left her a message apologizing for the disconnection of our phone call.  I asked her to give me a call back.

## 2019-01-30 NOTE — Telephone Encounter (Signed)
"  I'm calling you back."  Dr. Amalia Hailey can do your surgery on 03/16/2019.  "I can do it later than that.  They have me swabbing patients for Covid.  I can do it at the end of September."  He can do it on 04/06/2019.  "Okay, that is fine."  Someone from the surgical center will give you a call a day or two prior to your surgery date and will give you your arrival time.  You need to register with the surgical center online, instructions are in the brochure that we gave you.  "I'll take a look at it."

## 2019-03-22 ENCOUNTER — Telehealth: Payer: Self-pay | Admitting: *Deleted

## 2019-03-22 NOTE — Telephone Encounter (Signed)
"  I'm scheduled to have surgery on September 24.  I have some questions I'd like to ask.  First, when will I have my Covid test done?  I work for [employer] doing the [employer], so, I was just wondering."  Someone from the surgical center will give you a call and give you all the information regarding their protocol.  "Second, I don't see my post-op appointment.  When is my appointment scheduled for?"  I haven't scheduled it but I can do it for you.  Dr. Amalia Hailey can do it on April 14, 2019 at 12:15 pm.  "He can't do it any later than that?"  No, they close early on Fridays.  "Okay, that time is fine.  Thank you."

## 2019-03-31 ENCOUNTER — Telehealth: Payer: Self-pay | Admitting: *Deleted

## 2019-03-31 NOTE — Telephone Encounter (Signed)
"  I'm scheduled for surgery on 09/24.  Will I need a Covid test done?"  I don't think you will but you can call the surgery center to verify.  "What's the phone number to the surgery center?"  It is [phone].  You can also find it on the back of the brochure that we gave you.  "Will they call me a day before and let me know what time to be there?"  Yes, that is correct, someone from the surgical center will call you a day or two before your surgery date and give you your arrival time.

## 2019-04-05 ENCOUNTER — Telehealth: Payer: Self-pay | Admitting: *Deleted

## 2019-04-05 NOTE — Telephone Encounter (Signed)
DOS 04/06/2019 [facility] [phone]  BCBS: Eligibility Date - 07/13/2018 - 07/12/9998   In-Network    Max Per Benefit Period Year-to-Date Remaining  CoInsurance  20%   Deductible  $1250.00 [id number]  Out-Of-Pocket  $4890.00 [id number]   AMBULATORY SURGERY  In Network  [employer] Coinsurance  Not Applicable  [id number] per [place]

## 2019-04-06 ENCOUNTER — Other Ambulatory Visit: Payer: Self-pay | Admitting: Podiatry

## 2019-04-06 ENCOUNTER — Encounter: Payer: Self-pay | Admitting: Podiatry

## 2019-04-06 DIAGNOSIS — M2012 Hallux valgus (acquired), left foot: Secondary | ICD-10-CM

## 2019-04-06 MED ORDER — OXYCODONE-ACETAMINOPHEN 5-325 MG PO TABS: 1.0000 | ORAL_TABLET | Freq: Four times a day (QID) | ORAL | 0 refills | Status: DC | PRN

## 2019-04-06 MED ORDER — IBUPROFEN 800 MG PO TABS: 800.0000 mg | ORAL_TABLET | Freq: Three times a day (TID) | ORAL | 0 refills | Status: DC | PRN

## 2019-04-06 NOTE — Progress Notes (Signed)
.  postop

## 2019-04-14 ENCOUNTER — Other Ambulatory Visit: Payer: BC Managed Care – PPO

## 2019-04-14 ENCOUNTER — Other Ambulatory Visit: Payer: Self-pay

## 2019-04-14 ENCOUNTER — Ambulatory Visit (INDEPENDENT_AMBULATORY_CARE_PROVIDER_SITE_OTHER): Payer: BC Managed Care – PPO

## 2019-04-14 ENCOUNTER — Ambulatory Visit (INDEPENDENT_AMBULATORY_CARE_PROVIDER_SITE_OTHER): Payer: Self-pay | Admitting: Podiatry

## 2019-04-14 ENCOUNTER — Encounter: Payer: Self-pay | Admitting: Podiatry

## 2019-04-14 VITALS — BP 120/72 | HR 87 | Temp 97.2°F

## 2019-04-14 DIAGNOSIS — Z9889 Other specified postprocedural states: Secondary | ICD-10-CM

## 2019-04-14 DIAGNOSIS — M21612 Bunion of left foot: Secondary | ICD-10-CM | POA: Diagnosis not present

## 2019-04-14 MED ORDER — OXYCODONE-ACETAMINOPHEN 5-325 MG PO TABS: 1.0000 | ORAL_TABLET | Freq: Four times a day (QID) | ORAL | 0 refills | Status: AC | PRN

## 2019-04-19 NOTE — Progress Notes (Signed)
   Subjective:  Patient presents today status post bunionectomy left. DOS: 04/06/2019. She states she is doing well and improving. She reports some intermittent shooting pain. There are no modifying factors noted. She has been using the CAM boot as directed. Patient is here for further evaluation and treatment.    Past Medical History:  Diagnosis Date  . Biceps tendinitis 12/04/2014  . Pituitary adenoma ([place]) 09/19/2015      Objective/Physical Exam Neurovascular status intact.  Skin incisions appear to be well coapted with sutures and staples intact. No sign of infectious process noted. No dehiscence. No active bleeding noted. Moderate edema noted to the surgical extremity.  Radiographic Exam:  Orthopedic hardware and osteotomies sites appear to be stable with routine healing.  Assessment: 1. s/p bunionectomy left. DOS: 04/06/2019   Plan of Care:  1. Patient was evaluated. X-rays reviewed 2. Dressing changed. Keep clean, dry and intact for one week.  3. Continue using CAM boot.  4. Return to clinic in one week.   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 Going to [place] on October 14 for one week.    Edrick Kins, DPM [facility]  Dr. Edrick Kins, [address]                                        [place], [place] [address]                Office [phone]  Fax [phone]

## 2019-04-21 ENCOUNTER — Ambulatory Visit (INDEPENDENT_AMBULATORY_CARE_PROVIDER_SITE_OTHER): Payer: BC Managed Care – PPO | Admitting: Podiatry

## 2019-04-21 ENCOUNTER — Other Ambulatory Visit: Payer: Self-pay

## 2019-04-21 ENCOUNTER — Encounter: Payer: Self-pay | Admitting: Podiatry

## 2019-04-21 ENCOUNTER — Other Ambulatory Visit: Payer: BC Managed Care – PPO

## 2019-04-21 DIAGNOSIS — M21612 Bunion of left foot: Secondary | ICD-10-CM

## 2019-04-21 DIAGNOSIS — Z9889 Other specified postprocedural states: Secondary | ICD-10-CM

## 2019-04-22 ENCOUNTER — Other Ambulatory Visit: Payer: Self-pay | Admitting: Podiatry

## 2019-04-24 NOTE — Progress Notes (Signed)
   Subjective:  Patient presents today status post bunionectomy left. DOS: 04/06/2019. She states she is doing well. She reports a decrease in the pain but still has associated swelling. She has been using the CAM boot as directed. She denies modifying factors. Patient is here for further evaluation and treatment.    Past Medical History:  Diagnosis Date  . Biceps tendinitis 12/04/2014  . Pituitary adenoma ([place]) 09/19/2015      Objective/Physical Exam Neurovascular status intact.  Skin incisions appear to be well coapted with sutures and staples intact. No sign of infectious process noted. No dehiscence. No active bleeding noted. Moderate edema noted to the surgical extremity.  Assessment: 1. s/p bunionectomy left. DOS: 04/06/2019   Plan of Care:  1. Patient was evaluated.  2. Sutures removed.  3. Continue using CAM boot for two weeks.  4. Expected return to work on 05/08/2019 with restrictions.  5. Return to clinic in 2 weeks.   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 Going to [place] on October 14 for one week for her birthday.    Edrick Kins, DPM [facility]  Dr. Edrick Kins, [address]                                        [place], [place] [address]                Office [phone]  Fax [phone]

## 2019-05-05 ENCOUNTER — Encounter: Payer: Self-pay | Admitting: Podiatry

## 2019-05-05 ENCOUNTER — Other Ambulatory Visit: Payer: Self-pay

## 2019-05-05 ENCOUNTER — Ambulatory Visit (INDEPENDENT_AMBULATORY_CARE_PROVIDER_SITE_OTHER): Payer: BC Managed Care – PPO

## 2019-05-05 ENCOUNTER — Ambulatory Visit (INDEPENDENT_AMBULATORY_CARE_PROVIDER_SITE_OTHER): Payer: BC Managed Care – PPO | Admitting: Podiatry

## 2019-05-05 DIAGNOSIS — Z9889 Other specified postprocedural states: Secondary | ICD-10-CM

## 2019-05-05 DIAGNOSIS — M21612 Bunion of left foot: Secondary | ICD-10-CM

## 2019-05-08 NOTE — Progress Notes (Signed)
   Subjective:  Patient presents today status post bunionectomy left. DOS: 04/06/2019. She states she is doing well. She reports a decrease in the pain but still has associated swelling. She has been using the CAM boot as directed. She denies modifying factors. Patient is here for further evaluation and treatment.    Past Medical History:  Diagnosis Date  . Biceps tendinitis 12/04/2014  . Pituitary adenoma ([place]) 09/19/2015      Objective/Physical Exam Neurovascular status intact.  Skin incisions appear to be well coapted. No sign of infectious process noted. No dehiscence. No active bleeding noted. Moderate edema noted to the surgical extremity.  Radiographic Exam:  Orthopedic hardware and osteotomies sites appear to be stable with routine healing.   Assessment: 1. s/p bunionectomy left. DOS: 04/06/2019   Plan of Care:  1. Patient was evaluated. X-Rays reviewed.  2. Discontinue using CAM boot.  3. Post op shoe dispensed.  4. Begin ROM exercises.  5. Return to clinic in 4 weeks for final follow up X-Ray.    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 Going to [place] on October 14 for one week for her birthday.    Edrick Kins, DPM [facility]  Dr. Edrick Kins, [address]                                        [place],  [address]                Office [phone]  Fax [phone]

## 2019-05-09 ENCOUNTER — Encounter: Payer: Self-pay | Admitting: Podiatry

## 2019-06-02 ENCOUNTER — Ambulatory Visit (INDEPENDENT_AMBULATORY_CARE_PROVIDER_SITE_OTHER): Payer: BC Managed Care – PPO | Admitting: Podiatry

## 2019-06-02 ENCOUNTER — Encounter: Payer: Self-pay | Admitting: Podiatry

## 2019-06-02 ENCOUNTER — Ambulatory Visit (INDEPENDENT_AMBULATORY_CARE_PROVIDER_SITE_OTHER): Payer: BC Managed Care – PPO

## 2019-06-02 ENCOUNTER — Other Ambulatory Visit: Payer: Self-pay

## 2019-06-02 DIAGNOSIS — M21612 Bunion of left foot: Secondary | ICD-10-CM | POA: Diagnosis not present

## 2019-06-02 DIAGNOSIS — Z9889 Other specified postprocedural states: Secondary | ICD-10-CM

## 2019-06-11 NOTE — Progress Notes (Signed)
   Subjective:  Patient presents today status post bunionectomy left. DOS: 04/06/2019. She states she is doing well. She reports some intermittent swelling and some mild shooting pains and stiffness. She has been using the post op shoe as directed. There are no modifying factors noted at this time. Patient is here for further evaluation and treatment.    Past Medical History:  Diagnosis Date  . Biceps tendinitis 12/04/2014  . Pituitary adenoma ([place]) 09/19/2015      Objective/Physical Exam Neurovascular status intact.  Skin incisions appear to be well coapted. No sign of infectious process noted. No dehiscence. No active bleeding noted. Moderate edema noted to the surgical extremity.  Radiographic Exam:  Orthopedic hardware and osteotomies sites appear to be stable with routine healing.   Assessment: 1. s/p bunionectomy left. DOS: 04/06/2019   Plan of Care:  1. Patient was evaluated. X-Rays reviewed.  2. Continue restrictions at work. May resume full activity with no restrictions beginning on 07/17/2019.  3. Recommended good shoe gear. Discontinue using post op shoe.  4. Return to clinic as needed.     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 Going to [place] on October 14 for one week for her birthday.    Edrick Kins, DPM [facility]  Dr. Edrick Kins, [address]                                        [place], [place] [address]                Office [phone]  Fax [phone]
